# Patient Record
Sex: Female | Born: 1955 | Race: White | Hispanic: No | Marital: Married | Smoking: Never smoker
Health system: Northeastern US, Academic
[De-identification: ages and names within clinical notes are randomized; demographics above are authoritative.]

## PROBLEM LIST (undated history)

## (undated) ENCOUNTER — Encounter

---

## 2016-02-04 ENCOUNTER — Ambulatory Visit: Admitting: Hand Surgery

## 2016-03-31 ENCOUNTER — Ambulatory Visit: Admitting: Orthopaedic Surgery

## 2016-03-31 NOTE — Progress Notes (Signed)
.  Progress Notes  .  Patient: Kathy Morales, Kathy Morales  Provider: [name]    .  DOB: 07/06/1955 Age: 60 Y Sex: Female  .  PCP: Rella Larve   Date: 03/31/2016  .  --------------------------------------------------------------------------------  .  REASON FOR APPOINTMENT  .  1. L4-5 stenosis  .  2. Right lumbar radiculopathy  .  HISTORY OF PRESENT ILLNESS  .  GENERAL:   Kathy Morales is a pleasant 60 year old woman I am seeing today for  the first time. She was referred to me by Dr. Jonathon Bellows for right  lumbar radiculopathy. She has had right leg pain for many years  but it has recently flared up. She underwent a course of PT which  did not help. Dr. Jonathon Bellows did an epidural injection which gave her  excellent pain relief for about 24 hours before the pain  returned, which was attributed to the effect of the marcaine. She  has some mild pain in the left buttock, but most of the pain is  in the right buttock, posterior thigh and posterior calf. It  occasionally goes into the bottom of the right foot. It is worse  with any activity and by the end of the day she rates it a 10/10  in severity. When she is sitting, it is tolerable. She cannot  stand or walk for long periods of time and the leg pain prevents  her from gardening and cooking. She does home stretching which  does help a little. She denies any fever/chills, no changes in  bowel/bladder habits.  .  CURRENT MEDICATIONS  .  Taking Albuterol Sulfate 108 (90 Base) MCG/ACT Aerosol Powder  Breath Activated 2 puffs as needed Inhalation every 6 hrs  Taking Ativan 1 MG Tablet 1 tablet at bedtime as needed Orally  Once a day  Taking Calcium 1 tab Oral daily  Taking Fish Oil (omega-3 fatty acids)  Taking flovent 100MG  3 tabs twice daily  Taking HCTZ (hydrochlorothiazide) 50 mg twice daily  Taking Hydrocodone-Acetaminophen 5-325 MG Tablet 1-2 tablet as  needed Orally every 6 hrs  Taking Metformin 500 mg 1 tab Oral twice daily  Taking Pantoprazole Sodium 40 MG Tablet Delayed  Release 1 tablet  Orally twice daily  Taking Provigil 100 mg Tablet 3 tablet in the morning Orally Once  a day  Taking Simvastatin 80 MG Tablet 1 tablet in the evening Orally  Once a day  Taking Vitamin D once a day  Medication List reviewed and reconciled with the patient  .  PAST MEDICAL HISTORY  .  High cholesterol  Hypertention  NIDDM  GERD  Asthma  .  ALLERGIES  .  aspirin  NSAIDs  .  SURGICAL HISTORY  .  mastectomy for breast CA 2004  .  FAMILY HISTORY  .  Mother: alive, diagnosed with Diabetes, Heart Disease,  Hypertension  Father: alive, diagnosed with Diabetes, Hypertension, Heart  Disease  Non-Contributory  .  SOCIAL HISTORY  .  .  Tobaccohistory:Never smoked.  .  Work/Occupation: employed full-time, Engineer, site.  .  .  Alcohol Yes but rarely.  .  Lives at home independently, recently started using a cane to  ambulate.  [name]  REVIEW OF SYSTEMS  .  Per HPI.  [name]  VITAL SIGNS  .  Pain scale 8.  .  EXAMINATION  .  GENERAL: Lumbar spine MRI from an OSH in October  2017 was reviewed and uploaded into our system. I also reviewed  the radiologist's report. There are mild multilevel degenerative  changes seen throughout. Alignment is within normal limits. No  evidence of listhesis but there does appear to be a congenitally  narrow canal. At L4-5 there is a posterior disc bulge and  hypertrophic ligamentum flavum that results in severe central  stenosis and moderate foraminal stenosis. L5-S1 there is a small  posterior disc bulge that does not result in any canal narrowing.  No significant foraminal stenosis at L5-S1.  [name]  PHYSICAL EXAMINATION  .  Patient is well-nourished, well-appearing and in no distress.  Ambulates with an antalgic gait favoring the right leg and is  able to heel and toe walk with good strength and coordination.  Posture and alignment are within normal limits. 5/5 strength in  iliopsoas, quads, tibialis anterior, EHL and gastroc muscle  groups. Sensation is intact to light touch in the  L3-S1 nerve  distributions bilaterally but diminished in the right L5 and S1  distributions compared to the left. Negative straight leg raise  bilaterally. Diminished patellar and Achilles reflexes  bilaterally. Extremities are warm and well-perfused without any  peripheral edema.  .  ASSESSMENTS  .  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  Lumbar radiculopathy, right - M54.16  .  TREATMENT  .  Lumbar stenosis without neurogenic claudication  Notes: All findings were reviewed with Kathy Morales in the office  today. We reviewed her MRI results together. Her symptoms of  right sided lumbar radiculopathy correlate well with the stenosis  seen on MRI at L4-5. She has tried numerous conservative  treatments including medications, PT and an epidural injection  which actually aggravated her leg pain. She is interested in  definitive surgical intervention and I believe she is a good  candidate for L4-5 decompression and instrumented fusion due to  her congenital stenosis and preserved disc height at L4-5. I  believe that a decompression surgery alone would have a high  likelilhood of developing instability in the future because I  will have to resect a large portion of her facet joints to  achieve adequade decompression of the neural elements. I briefly  described the risks and benefits of operative intervention as  well as the expected recovery time and she wishes to proceed. We  will schedule surgery for sometime in January and I will see her  back in the office for a preop visit. All questions were answered  and she agrees with the plan.  [name]  PROCEDURES  .  Please cc: Dr. Nelly Rout fax: [phone].  [name]  FOLLOW UP  .  for preop visit  .  Electronically signed by [name] , MD on  03/31/2016 at 04:51 PM EDT  .  Document electronically signed by [name]    .

## 2016-03-31 NOTE — Progress Notes (Signed)
* * *        **Kathy Morales**    --- ---    60 Y old Female, DOB: Oct 17, 1955, External MRN: [id number]    Account Number: [id number]    [address] [place], [id number]    Home: [phone]    Insurance: HMO BLUE OUT [employer]    PCP: Rella Larve Referring: Jerilee Field    [facility]        * * *    03/31/2016  Progress Notes: [name], MD **CHN#:** [phone]    --- ---    ---        Reason for Appointment    ---      1\. L4-5 stenosis    ---    2\. Right lumbar radiculopathy    ---      History of Present Illness    ---     _GENERAL_ :    Kathy Morales is a pleasant 60 year old woman I am seeing today for the first time.  She was referred to me by Dr. Jonathon Bellows for right lumbar radiculopathy. She has  had right leg pain for many years but it has recently flared up. She underwent  a course of PT which did not help. Dr. Jonathon Bellows did an epidural injection which  gave her excellent pain relief for about 24 hours before the pain returned,  which was attributed to the effect of the marcaine. She has some mild pain in  the left buttock, but most of the pain is in the right buttock, posterior  thigh and posterior calf. It occasionally goes into the bottom of the right  foot. It is worse with any activity and by the end of the day she rates it a  10/10 in severity. When she is sitting, it is tolerable. She cannot stand or  walk for long periods of time and the leg pain prevents her from gardening and  cooking. She does home stretching which does help a little. She denies any  fever/chills, no changes in bowel/bladder habits.      Current Medications    ---    Taking     * Albuterol Sulfate 108 (90 Base) MCG/ACT Aerosol Powder Breath Activated 2 puffs as needed Inhalation every 6 hrs    ---    * Ativan 1 MG Tablet 1 tablet at bedtime as needed Orally Once a day    ---    * Calcium 1 tab Oral daily    ---    * Fish Oil (omega-3 fatty acids)     ---    * flovent 100MG  3 tabs twice daily    ---     * HCTZ (hydrochlorothiazide) 50 mg twice daily    ---    * Hydrocodone-Acetaminophen 5-325 MG Tablet 1-2 tablet as needed Orally every 6 hrs    ---    * Metformin 500 mg 1 tab Oral twice daily    ---    * Pantoprazole Sodium 40 MG Tablet Delayed Release 1 tablet Orally twice daily    ---    * Provigil 100 mg Tablet 3 tablet in the morning Orally Once a day    ---    * Simvastatin 80 MG Tablet 1 tablet in the evening Orally Once a day    ---    * Vitamin D once a day    ---    * Medication List reviewed and reconciled with the patient    ---  Past Medical History    ---       High cholesterol.        ---    Hypertention.        ---    NIDDM.        ---    GERD.        ---    Asthma.        ---      Surgical History    ---      mastectomy for breast CA 2004    ---      Family History    ---      Mother: alive, diagnosed with Diabetes, Heart Disease, Hypertension    ---    Father: alive, diagnosed with Diabetes, Hypertension, Heart Disease    ---    Non-Contributory    ---      Social History    ---    Tobacco  history: Never smoked.    Work/Occupation: employed full-time, Engineer, site. [name]    Alcohol  Yes but rarely.   Lives at home independently, recently started using  a cane to ambulate.    ---      Allergies    ---      aspirin    ---    NSAIDs    ---      Review of Systems    ---    Per HPI.      Vital Signs    ---    Pain scale 8.      Examination    ---     _GENERAL_ :    Lumbar spine MRI from an OSH in October 2017 was reviewed and uploaded into  our system. I also reviewed the radiologist's report. There are mild  multilevel degenerative changes seen throughout. Alignment is within normal  limits. 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 At L4-5 there is a posterior disc bulge and hypertrophic  ligamentum flavum that results in severe central stenosis and moderate  foraminal stenosis. L5-S1 there is a small posterior disc bulge that does not  result  in any canal narrowing. No significant foraminal stenosis at L5-S1.          Physical Examination    ---    Patient is well-nourished, well-appearing and in no distress. Ambulates with  an antalgic gait favoring the right leg and is able to heel and toe walk with  good strength and coordination. Posture and alignment are within normal  limits. 5/5 strength in iliopsoas, quads, tibialis anterior, EHL and gastroc  muscle groups. Sensation is intact to light touch in the L3-S1 nerve  distributions bilaterally but diminished in the right L5 and S1 distributions  compared to the left. Negative straight leg raise bilaterally. Diminished  patellar and Achilles reflexes bilaterally. Extremities are warm and well-  perfused without any peripheral edema.      Assessments    ---    1\. Lumbar stenosis without neurogenic claudication - M48.061 (Primary)    ---    2\. Lumbar radiculopathy, right - M54.16    ---      Treatment    ---       **1\. Lumbar stenosis without neurogenic claudication**    Notes: All findings were reviewed with Carolyn in the office today. We  reviewed her MRI results together. Her symptoms of right sided lumbar  radiculopathy correlate well with the stenosis  seen on MRI at L4-5. She has  tried numerous conservative treatments including medications, PT and an  epidural injection which actually aggravated her leg pain. She is interested  in definitive surgical intervention and I believe she is a good candidate for  L4-5 decompression and instrumented fusion due to her congenital stenosis and  preserved disc height at L4-5. I believe that a decompression surgery alone  would have a high likelilhood of developing instability in the future because  I will have to resect a large portion of her facet joints to achieve adequade  decompression of the neural elements. I briefly described the risks and  benefits of operative intervention as well as the expected recovery time and  she wishes to proceed. We will  schedule surgery for sometime in January and I  will see her back in the office for a preop visit. All questions were answered  and she agrees with the plan.    ---      Procedures    ---    Please cc: Dr. Nelly Rout fax: [phone].      Follow Up    ---    for preop visit    Electronically signed by [name] , MD on 03/31/2016 at 04:51 PM EDT    Sign off status: Completed        * * *        [facility]    [address]    [place], [place] [address]    Tel: [phone]    Fax: [phone]              * * *          Patient: OLISA, QUESNEL DOB: Dec 27, 1955 Progress Note: [name], MD  03/31/2016    ---    Note generated by eClinicalWorks EMR/PM Software ([email or web address])

## 2016-04-21 ENCOUNTER — Ambulatory Visit: Admitting: Orthopaedic Surgery

## 2016-04-21 ENCOUNTER — Ambulatory Visit: Admitting: Anesthesiology

## 2016-04-21 ENCOUNTER — Ambulatory Visit

## 2016-04-21 LAB — HX HEM-ROUTINE
HX HCT: 42.1 % (ref 32.0–45.0)
HX HGB: 13.6 g/dL (ref 11.0–15.0)
HX MCH: 29.5 pg (ref 26.0–34.0)
HX MCHC: 32.3 g/dL (ref 32.0–36.0)
HX MCV: 91.3 fL (ref 80.0–98.0)
HX MPV: 10.3 fL (ref 9.1–11.7)
HX NRBC #: 0 10*3/uL
HX NUCLEATED RBC: 0 %
HX PLT: 336 10*3/uL (ref 150–400)
HX RBC BLOOD COUNT: 4.61 M/uL (ref 3.70–5.00)
HX RDW: 12.6 % (ref 11.5–14.5)
HX WBC: 8.9 10*3/uL (ref 4.0–11.0)

## 2016-04-21 LAB — HX CHEM-METABOLIC: HX HEMOGLOBIN A1C: 6.2 % — ABNORMAL HIGH (ref 4.3–5.8)

## 2016-04-21 LAB — HX TRANSFUSION
HX ABO-RH INTERPRETATION (GEL): A POS
HX ANTIBODY SCREEN (GEL): NEGATIVE

## 2016-04-21 LAB — HX CHEM-PANELS
HX ANION GAP: 12 (ref 5–18)
HX BLOOD UREA NITROGEN: 14 mg/dL (ref 6–24)
HX CHLORIDE (CL): 98 meq/L (ref 98–110)
HX CO2: 30 meq/L (ref 20–30)
HX CREATININE (CR): 0.84 mg/dL (ref 0.57–1.30)
HX GFR, AFRICAN AMERICAN: 87 mL/min/{1.73_m2} — ABNORMAL LOW
HX GFR, NON-AFRICAN AMERICAN: 75 mL/min/{1.73_m2} — ABNORMAL LOW
HX GLUCOSE: 163 mg/dL — ABNORMAL HIGH (ref 70–139)
HX POTASSIUM (K): 3.6 meq/L (ref 3.6–5.1)
HX SODIUM (NA): 140 meq/L (ref 135–145)

## 2016-04-21 LAB — HX DIABETES
HX GLUCOSE: 163 mg/dL — ABNORMAL HIGH (ref 70–139)
HX HEMOGLOBIN A1C: 6.2 % — ABNORMAL HIGH (ref 4.3–5.8)

## 2016-04-21 NOTE — Progress Notes (Signed)
****    ---    **  Patient:** Kathy Morales, Kathy Morales     **Account Number:** [id number] **External MRN:** [id number]  **Provider:**  Appointment Resource     **DOB:** 09/06/1955 **Age:** 66 Y **Sex:** Female  **Date:** 04/21/2016     **Phone:** [phone]     **Address:** [address] [place], [id number]     **Pcp:** Clare Charon Silver        * * *         **Subjective:**        ---      **Chief Complaints:**    ------      1\. M54.16 DOS 05/06/16. 2. Please see Clinic Notes in [facility].[name]    ------     **Medical History:** High cholesterol, Hypertention, NIDDM, GERD, Asthma.        ------     **Allergies:** aspirin, NSAIDs.    ------        **Objective:**        ---         **Assessment:**        ---         **Plan:**        ---        ------    ---    ---          **Provider:** Appointment Resource    ---     **Patient:** Kathy Morales, Kathy Morales **DOB:** [birth year] **Date:** 04/21/2016    ---    Electronically signed by Bettey Mare on 04/21/2016 at 10:41 AM EST    Sign off status: Completed

## 2016-04-21 NOTE — Progress Notes (Signed)
* * *        **Kathy Morales**    --- ---    77 Y old Female, DOB: 1956/05/31, External MRN: [id number]    Account Number: [id number]    [address] [name], [place]    Home: [phone]    Insurance: HMO BLUE OUT [employer]    PCP: Rella Larve Referring: Rella Larve    [facility]        * * *    04/21/2016  Progress Notes: [name], MD **CHN#:** [phone]    --- ---    ---        Reason for Appointment    ---      1\. L4-5 stenosis    ---    2\. Right lumbar radiculopathy    ---      History of Present Illness    ---     _GENERAL_ Kathy Morales is here today for preop visit with me and with our anesthesiologists.  She is scheduled for L4-5 TLIF on 05/06/16. She reports no change in her  symptoms since I saw her last. She is still having right leg pain in an L5  distribution as well as bilateral buttock pain when she stands or walks for  more than a few minutes. It is improved with rest. She has tried multiple  modes of conservative therapy over the years, all without lasting relief  therefore she is seeking definitive surgical management. She has had a recent  URI and was on a steroid taper for her respiratory symptoms so her blood  sugard have been more difficult to control.      Current Medications    ---    Taking     * Albuterol Sulfate 108 (90 Base) MCG/ACT Aerosol Powder Breath Activated 2 puffs as needed Inhalation every 6 hrs    ---    * Ativan 1 MG Tablet 1 tablet at bedtime as needed Orally Once a day    ---    * Calcium 1 tab Oral daily    ---    * Fish Oil (omega-3 fatty acids)     ---    * flovent 100MG  3 tabs twice daily    ---    * HCTZ (hydrochlorothiazide) 50 mg twice daily    ---    * Hydrocodone-Acetaminophen 5-325 MG Tablet 1-2 tablet as needed Orally every 6 hrs    ---    * Metformin 500 mg 1 tab Oral twice daily    ---    * Pantoprazole Sodium 40 MG Tablet Delayed Release 1 tablet Orally twice daily    ---    * Provigil 100 mg Tablet 3 tablet in  the morning Orally Once a day    ---    * Simvastatin 80 MG Tablet 1 tablet in the evening Orally Once a day    ---    * Vitamin D once a day    ---    * Medication List reviewed and reconciled with the patient    ---      Past Medical History    ---       High cholesterol.        ---    Hypertention.        ---    NIDDM.        ---    GERD.        ---    Asthma.        ---  Surgical History    ---      mastectomy for breast CA 2004    ---      Family History    ---      Mother: alive, diagnosed with Diabetes, Hypertension, Heart Disease    ---    Father: alive, diagnosed with Diabetes, Hypertension, Heart Disease    ---    Non-Contributory    ---      Social History    ---    Tobacco  history: Never smoked.    Work/Occupation: employed full-time, Engineer, site. [name]    Alcohol  Yes but rarely.   Lives at home independently, recently started using  a cane to ambulate.    ---      Allergies    ---      aspirin    ---    NSAIDs    ---      Review of Systems    ---    Per HPI.      Vital Signs    ---    Pain scale 8.      Examination    ---     _GENERAL_ :    Lumbar spine MRI from an OSH in October 2017 was reviewed again. I also  reviewed the radiologist's report. There are mild multilevel degenerative  changes seen throughout. Alignment is within normal limits. No evidence of  listhesis but there does appear to be a congenitally narrow canal. At L4-5  there is a posterior disc bulge and hypertrophic ligamentum flavum that  results in severe central stenosis and moderate bilateral foraminal stenosis.  L5-S1 there is a small posterior disc bulge that does not result in any canal  narrowing. No significant foraminal stenosis at L5-S1.    Upright lumbar spine radiographs were reviewed. There is maintenance of lumbar  lordosis, no listhesis. degenerative disc disease at L5-S1 with loss of  height. Disc height at other levels is preserved. No evidence of dynamic  instability.          Physical Examination     ---    Patient is well-nourished, well-appearing and in no distress. Ambulates with  an antalgic gait favoring the right leg and is able to heel and toe walk with  good strength and coordination. Posture and alignment are within normal  limits. 5/5 strength in iliopsoas, quads, tibialis anterior, EHL and gastroc  muscle groups. Sensation is intact to light touch in the L3-S1 nerve  distributions bilaterally but diminished in the right L5 and S1 distributions  compared to the left. Negative straight leg raise bilaterally. Diminished  patellar and Achilles reflexes bilaterally. Extremities are warm and well-  perfused without any peripheral edema.      Assessments    ---    1\. Lumbar radiculopathy, right - M54.16    ---    2\. 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 Others**    Notes: All findings were reviewed with Mackenzey in the office today. Her  symptoms of right sided lumbar radiculopathy correlate well with the stenosis  seen on MRI at L4-5. I believe she does have an element of neurogenic  claudication with some bilateral buttock pain as well. She has tried numerous  conservative treatments including medications, PT and an epidural injection  all without lasting symptom relief. I believe she  is a good candidate for L4-5  decompression and instrumented fusion due to her congenital stenosis and  preserved disc height at L4-5. I believe that a decompression surgery alone  would have a high likelilhood of developing instability in the future because  I will have to resect a large portion of her facet joints to achieve adequade  decompression of the neural elements. The procedure, risks and benefits were  discussed with the patient in detail. Risks include, but are not limited to,  bleeding, infection, nerve injury, paralysis, permanent numbness or weakness,  persistent back/leg pain, CSF leak/dural tear, hardware failure or  malpositioning,  nonunion, need for further surgery, and medical/anesthesia  complications. All of these risks were discussed in layman's terms. She has a  good understanding and reasonable expectations with regard to expected  surgical outcomes, goals of surgery, and recovery time. All questions were  answered. Informed consent was signed in the office today. Of note, she is  going to try and recover in [place] after her two week postop visit due to some  social reasons. Her husband will be traveling with her and helping to take  care of her. I will call her with the results of her hemoglobin A1c if it is  over 8.0 and we will unfortunately have to postpone her surgery if that is the  case. She understands and agrees with the plan.    ---      Procedures    ---    Please cc: Dr. Nelly Rout fax: [phone].      Follow Up    ---    for preop visit    Electronically signed by [name] , MD on 04/21/2016 at 02:40 PM EST    Sign off status: Completed        * * *        [facility]    [address]    [place], [place] [address]    Tel: [phone]    Fax: [phone]              * * *          Patient: Kathy Morales, Kathy Morales DOB: 1955/09/09 Progress Note: [name], MD  04/21/2016    ---    Note generated by eClinicalWorks EMR/PM Software ([email or web address])

## 2016-04-21 NOTE — Progress Notes (Signed)
.  Progress Notes  .  Patient: Kathy Morales, Kathy Morales  Provider: [name]    .  DOB: 09/26/1955 Age: 60 Y Sex: Female  .  PCP: Rella Larve   Date: 04/21/2016  .  --------------------------------------------------------------------------------  .  REASON FOR APPOINTMENT  .  1. L4-5 stenosis  .  2. Right lumbar radiculopathy  .  HISTORY OF PRESENT ILLNESS  .  GENERAL:   Aerilyn is here today for preop visit with me and with our  anesthesiologists. She is scheduled for L4-5 TLIF on 05/06/16. She  reports no change in her symptoms since I saw her last. She is  still having right leg pain in an L5 distribution as well as  bilateral buttock pain when she stands or walks for more than a  few minutes. It is improved with rest. She has tried multiple  modes of conservative therapy over the years, all without lasting  relief therefore she is seeking definitive surgical management.  She has had a recent URI and was on a steroid taper for her  respiratory symptoms so her blood sugard have been more difficult  to control.  .  CURRENT MEDICATIONS  .  Taking Albuterol Sulfate 108 (90 Base) MCG/ACT Aerosol Powder  Breath Activated 2 puffs as needed Inhalation every 6 hrs  Taking Ativan 1 MG Tablet 1 tablet at bedtime as needed Orally  Once a day  Taking Calcium 1 tab Oral daily  Taking Fish Oil (omega-3 fatty acids)  Taking flovent 100MG  3 tabs twice daily  Taking HCTZ (hydrochlorothiazide) 50 mg twice daily  Taking Hydrocodone-Acetaminophen 5-325 MG Tablet 1-2 tablet as  needed Orally every 6 hrs  Taking Metformin 500 mg 1 tab Oral twice daily  Taking Pantoprazole Sodium 40 MG Tablet Delayed Release 1 tablet  Orally twice daily  Taking Provigil 100 mg Tablet 3 tablet in the morning Orally Once  a day  Taking Simvastatin 80 MG Tablet 1 tablet in the evening Orally  Once a day  Taking Vitamin D once a day  Medication List reviewed and reconciled with the patient  .  PAST MEDICAL HISTORY  .  High  cholesterol  Hypertention  NIDDM  GERD  Asthma  .  ALLERGIES  .  aspirin  NSAIDs  .  SURGICAL HISTORY  .  mastectomy for breast CA 2004  .  FAMILY HISTORY  .  Mother: alive, diagnosed with Diabetes, Hypertension, Heart  Disease  Father: alive, diagnosed with Diabetes, Hypertension, Heart  Disease  Non-Contributory  .  SOCIAL HISTORY  .  .  Tobaccohistory:Never smoked.  .  Work/Occupation: employed full-time, Engineer, site.  .  .  Alcohol Yes but rarely.  .  Lives at home independently, recently started using a cane to  ambulate.  [name]  REVIEW OF SYSTEMS  .  Per HPI.  [name]  VITAL SIGNS  .  Pain scale 8.  .  EXAMINATION  .  GENERAL: Lumbar spine MRI from an OSH in October  2017 was reviewed again. I also reviewed the radiologist's  report. There are mild multilevel degenerative changes seen  throughout. Alignment is within normal limits. No evidence of  listhesis but there does appear to be a congenitally narrow  canal. At L4-5 there is a posterior disc bulge and hypertrophic  ligamentum flavum that results in severe central stenosis and  moderate bilateral foraminal stenosis. L5-S1 there is a small  posterior disc bulge that does not result in any canal narrowing.  No significant foraminal stenosis at L5-S1.Upright lumbar spine  radiographs were reviewed. There is maintenance of lumbar  lordosis, no listhesis. degenerative disc disease at L5-S1 with  loss of height. Disc height at other levels is preserved. No  evidence of dynamic instability.  .  PHYSICAL EXAMINATION  .  Patient is well-nourished, well-appearing and in no distress.  Ambulates with an antalgic gait favoring the right leg and is  able to heel and toe walk with good strength and coordination.  Posture and alignment are within normal limits. 5/5 strength in  iliopsoas, quads, tibialis anterior, EHL and gastroc muscle  groups. Sensation is intact to light touch in the L3-S1 nerve  distributions bilaterally but diminished in the right L5 and  S1  distributions compared to the left. Negative straight leg raise  bilaterally. Diminished patellar and Achilles reflexes  bilaterally. Extremities are warm and well-perfused without any  peripheral edema.  .  ASSESSMENTS  .  Lumbar radiculopathy, right - M54.16  .  Spinal stenosis of lumbar region with neurogenic claudication -  M48.062  .  TREATMENT  .  Others  Notes: All findings were reviewed with Courtnie in the office  today. Her symptoms of right sided lumbar radiculopathy correlate  well with the stenosis seen on MRI at L4-5. I believe she does  have an element of neurogenic claudication with some bilateral  buttock pain as well. She has tried numerous conservative  treatments including medications, PT and an epidural injection  all without lasting symptom relief. I believe she is a good  candidate for L4-5 decompression and instrumented fusion due to  her congenital stenosis and preserved disc height at L4-5. I  believe that a decompression surgery alone would have a high  likelilhood of developing instability in the future because I  will have to resect a large portion of her facet joints to  achieve adequade decompression of the neural elements. The  procedure, risks and benefits were discussed with the patient in  detail. Risks include, but are not limited to, bleeding,  infection, nerve injury, paralysis, permanent numbness or  weakness, persistent back/leg pain, CSF leak/dural tear, hardware  failure or malpositioning, nonunion, need for further surgery,  and medical/anesthesia complications. All of these risks were  discussed in layman's terms. She has a good understanding and  reasonable expectations with regard to expected surgical  outcomes, goals of surgery, and recovery time. All questions were  answered. Informed consent was signed in the office today. Of  note, she is going to try and recover in [place] after her two  week postop visit due to some social reasons. Her husband will be  traveling  with her and helping to take care of her. I will call  her with the results of her hemoglobin A1c if it is over 8.0 and  we will unfortunately have to postpone her surgery if that is the  case. She understands and agrees with the plan.  [name]  PROCEDURES  .  Please cc: Dr. Nelly Rout fax: [phone].  [name]  FOLLOW UP  .  for preop visit  .  Electronically signed by [name] , MD on  04/21/2016 at 02:40 PM EST  .  Document electronically signed by [name]    .

## 2016-05-06 ENCOUNTER — Inpatient Hospital Stay: Admission: RE | Admit: 2016-05-06 | Discharge: 2016-05-09 | Disposition: A | Discharge status: Home Health | Payer: HMO

## 2016-05-06 ENCOUNTER — Inpatient Hospital Stay
Admit: 2016-05-06 | Disposition: A | Discharge status: Home Health | Source: Ambulatory Visit | Attending: Orthopaedic Surgery | Admitting: Orthopaedic Surgery

## 2016-05-06 LAB — HX POINT OF CARE
HX GLUCOSE-POCT: 116 mg/dL (ref 70–139)
HX GLUCOSE-POCT: 138 mg/dL (ref 70–139)
HX GLUCOSE-POCT: 126 mg/dL (ref 70–139)

## 2016-05-06 NOTE — Op Note (Signed)
Patient    Kathy Morales, Kathy Morales            Med Rec #:  [id number]  Name:  Operation  05/06/2016                Pt.  Dt:                                  Location:  .  [name]                               OPERATIVE REPORT  .  [name]  PREOPERATIVE DIAGNOSES:  1.  Right L5 radiculopathy.  2.  L4-L5 stenosis.  [name]  POSTOPERATIVE DIAGNOSES:  1.  Right L5 radiculopathy.  2.  L4-L5 stenosis.  [name]  PROCEDURE PERFORMED:  1.  L4-L5 laminectomy with a right-sided facetectomy for decompression for  central and lateral recess decompression of the L5 nerve root.  2.  Posterior L4-L5 fusion with cortical screw instrumentation.  3.  Insertion of interbody device, L4-L5.  4.  Use of local autologous and a Formagraft bone graft extender.  5. Repair of incidental durotomy x2.  .  SURGEON:  [name], M.D.  .  ASSISTANT:  Ellie Lunch, PA-C.  [name]  ANESTHESIA:  General endotracheal.  .  ESTIMATED BLOOD LOSS:  100 mL.  .  IV FLUIDS:  2 liters of crystalloid.  .  IMPLANTS USED:  NuVasive MAS-PLIF cortical screw system was used.  All 4  screws measured 5.5x35 mm.  Interbody device was a 11 mm cage in height,  25mm in length with 4 degrees of lordosis.  Two 40 mm prebent lordotic  rods, 4 modular heads and 4 set screws were also used.  [name]  FINDINGS:  Severe central and lateral recess stenosis at L4-L5 with a very  thin dura and thickened ligamentum flavum with dural adhesions.  .  COMPLICATIONS:  None.  .  CONDITION:  Extubated and stable to the recovery room.  .  INDICATIONS FOR PROCEDURE:  Kathy Morales is a 60 year old woman with a history  of right-sided leg pain for many years.  She has undergone a course of  physical therapy and tried multiple epidural injections, which only gave  her relief for about 24 hours.  Her MRI shows severe central and lateral  recess stenosis at L4-L5.  I felt that her symptoms correlated well with  the MRI and she was interested in definitive operative intervention.  I  recommended L4-L5 decompression and given the amount of  stenosis and a  congenitally narrow canal, I did also recommend a fusion procedure with  interbody device and cortical trajectory screws.  I explained that the  risks of surgery include but are not limited to bleeding, infection,  persistent leg pain and/or back pain, spinal fluid leak/dural tear, bowel  or bladder dysfunction, hardware malposition or failure, nonunion and  persistent numbness, tingling or weakness in her legs in addition to  medical/anesthesia related complications.  There is also risk of adjacent  segment degeneration given her relatively young age.  Once all of her  questions were answered, Kathy Morales elected to proceed with operative  intervention.  Informed consent was obtained.  .  DESCRIPTION OF PROCEDURE:  The patient was seen and identified in the  preoperative holding area.  The lumbar region was marked.  She was brought  back to the  operating room and placed under general endotracheal anesthesia  without complication.  A Foley catheter was placed. She was then gently  transferred into the prone position on the Kathy Morales spine table.  All bony  prominences were padded.   She received Ancef for antibiotic prophylaxis  per protocol.  SCDs were on throughout the case for DVT prophylaxis.  The  lumbar region was prepped and draped in the usual sterile fashion.  Formal  timeout was done prior to the start of the procedure.  A sterile spinal  needle was placed in the lower lumbar region and a lateral fluoroscopic  image was obtained to plan the level of my incision.  Skin was sharply  incised in the midline centered over the L4-5 disc space and subcutaneous  tissues were dissected down using Bovie to maintain hemostasis.  I  identified the lumbar fascia and cleared it to either side with a Cobb.  Lumbar fascia was then incised inline with the skin incision using a Bovie.  The paraspinal muscles were reflected off of the L4 and L5 lamina  bilaterally.  I placed a Woodson at the level of the L4 pars  and obtained a  lateral fluoroscopic image to confirm the appropriate operative level.  I  switched the patient's right-sided and completed exposure and then placed a  Woodson at the level of the L5 pars and again confirmed the appropriate  operative level on lateral fluoroscopic image.  Care was taken to maintain  the integrity of the facet joint capsules at L3-L4.  A deep self-retaining  retractor was placed over the L4-L5 facet joints.  I identified the lateral  border of the L4 and L5 pars in order to use the anatomic landmarks to plan  the starting point of my cortical trajectory screws.  Once I had adequate  exposure, I obtained a perfect AP x-ray of the L4 vertebral body.  I was  able to see the medial border of the pedicles.  I used a high-speed bur to  make the appropriate starting point for my cortical screws at the 5 and 7  o'clock position on the left and right pedicles respectively.  The process  was then repeated for the starting point of the L5 cortical trajectory  screws.  I then brought the C-arm in for a lateral x-ray.  I obtained  lateral x-ray of L4 and L5.  Under lateral fluoroscopic guidance, the  high-speed bur followed by a hand drill the cortical trajectory screw pass  were prepared.  After each step, the trajectory was checked with the  ball-tip probe to ensure that there is a good bony endpoint and no medial  or lateral breaches.  The holes were then tapped with a 5.58mm tap.  I then  placed two 5.5x35 mm cortical screws at L4.  Both screws had excellent  purchase.  The process was repeated at L5 level.  Again, I confirmed that  there were no medial or lateral breaches and that there was good bony  endpoint using the ball-tip probe.  The L5 screws also had excellent  purchase.  I then turned my attention to the decompression portion of the  procedure.  The L4 spinous process and the cranial aspect of the L5 spinous  processes were removed with a rongeur.  Bone was saved for local  bone  graft.  The lamina of L4 and proximal aspect of L5 were thinned down using  a high-speed bur.  I carried out a central  laminectomy of the remaining  lamina staying above the ligamentum flavum.  Ligamentum was noted to be  very hypertrophic.  There were also many adhesions between the ligamentum  and the dura.  Her dura was noted to be extremely thin in some areas due to  the prolonged stenosis.  I carefully tried to develop a plane between the  ligamentum and the dura.  I removed the ligamentum flavum using a 3 and 4  mm Kerrison.  At the proximal aspect of the laminectomy, there was an  adhesion between the ligamentum flavum and the dura and I did encounter a  dural tear with an active spinal fluid leak.  This was covered with a patty  while I completed the laminectomy to expose the tear more completely.  Once  I could visualize where the spinal fluid leak was coming from, I performed  a dural repair using 6-0 Gore-Tex suture in a figure of 8 fashion.  There  was no active CSF leakage after that.  I then proceeded with the  right-sided lateral recess decompression.  I used a high-speed bur to  remove the majority of the L4-L5 facet joint.  The lateral recess,  especially on the right was noted to be extremely stenotic with a lot of  compression on the traversing L5 nerve root.  Again, there were a lot of  adhesions between the ligamentum flavum and the dura.  I carefully  developed a plane between the ligamentum and the dura using a Consulting civil engineer.  I was then able to identify the traversing L5 nerve root.  As I  continued my laminectomy in a caudal direction and removed the superior  aspect of the L5 lamina, I again encountered an area where the dura was  extremely thin and resulted another spinal fluid leak as the dural was  being manipulated during the decompression.  This was covered and protected  with a patty and I proceeded with the interbody portion of the procedure.  I mobilized the traversing L5  nerve root and thecal sac from the L4-L5 disc  on the right.  There were a lot of epidural veins that were actively  bleeding and these were controlled with bipolar electrocautery, Surgicel  and thrombin-soaked patties.  Once I had controlled the epidural bleeding  and identified the annulus of the L4-L5 disk, this was incised with a #15  blade.  The traversing L5 nerve root and thecal sac were protected with a  nerve root retractor during this portion of the procedure.  I introduced  the opening 7 mm shaver and checked the depth using a lateral fluoroscopic  image.  I then proceeded in 1 mm increments up to an 11 mm shaver.  This  had good bony endplate contact.  I confirmed this was of good height for  the implant using a lateral fluoroscopic image.  Curved curettes were used  to complete the disc space prep and remove endplate cartilage.  The disc  space was then packed with a mixture of Formagraft and local autograft  using a funnel.  The 11 mm in height with 4 degrees of lordosis titanium  interbody spacer was filled with the same mixture of bone graft.  I then  inserted the spacer into the disc space under lateral fluoroscopic  guidance.  I confirmed appropriate depth and then turned the implant and  rotated the implant 90 degrees.  The inserter was removed and the wound was  copiously irrigated.  I  confirmed that there was no further compression on  the traversing L5 nerve root.  The left lateral recess was not noted to be  as stenotic as the right side.  I palpated the medial aspect of the L4 and  L5 pedicles bilaterally using a Woodson elevator to confirm that there were  no medial breaches with the cortical screws.  The wound was again copiously  irrigated.  I then turned my attention to repairing the second dural tear.  Patties were removed and I had adequate exposure of spinal fluid leak.  Again, a 6-0 Gore-Tex suture was used to perform a simple dural repair in a  figure of 8 fashion.  I had  anesthesiologist perform Valsalva maneuver in  the patient up to 40 mmHg and hold it for 15 seconds.  There was no further  active CSF leakage.  The wound was again copiously irrigated.  The  left-sided facet joint was decorticated and the remaining bone graft  mixture was placed over the left-sided facet joint.  Modular heads were  placed on the cortical screws and the screws were advanced into their final  position.  Again, all screws had excellent bony purchase. Two prebent 40 mm  rods were placed in the screw heads and setscrews were placed and final  tightened.  I again checked the medial aspect of all of the L4 and L5  pedicles bilaterally and again confirmed that there were no breaches of the  cortical screws.  I confirmed that the right L5 nerve root was adequately  decompressed.  DuraSeal was then placed over the laminectomy defect.  FloSeal was placed in the gutters.  The deep self-retaining retractor was  removed and I inspected the wound for any active bleeding, which there was  none.  I obtained final lateral and AP x-rays and confirmed that all  implants were in good position.  Wound was then closed.  The lumbar fascia  was closed with running and interrupted #1 Vicryl suture.  Subcutaneous  tissue was closed with interrupted 2-0 Vicryl and skin was closed with a  running 3-0 nylon suture.  Sterile dressings were placed and the patient  was gently transferred to the Morales bed in the supine position.  The  Foley catheter was removed.  She was extubated without complications.  All  counts were correct x2 at the completion of the procedure.  There were no  complications during the case.  As the attending surgeon of record, I  attest I was present for the entire operation.  Ellie Lunch, PA-C was also  present as there was no qualified resident assistant available.  .  .  .  Electronically Signed  [name], MD 05/10/2016 07:58 A  .  .  .  .  Dictated by: [name], MD  .  D:     05/06/2016  T:    05/06/2016 03:19 P  Dictation ID:  [id number]/Doc#  [id number]  .  cc:  .  [name]      Document is preliminary until electronically or manually signed by                             attending physician.

## 2016-05-07 LAB — HX POINT OF CARE
HX GLUCOSE-POCT: 136 mg/dL (ref 70–139)
HX GLUCOSE-POCT: 147 mg/dL — ABNORMAL HIGH (ref 70–139)
HX GLUCOSE-POCT: 154 mg/dL — ABNORMAL HIGH (ref 70–139)

## 2016-05-08 LAB — HX POINT OF CARE
HX GLUCOSE-POCT: 121 mg/dL (ref 70–139)
HX GLUCOSE-POCT: 143 mg/dL — ABNORMAL HIGH (ref 70–139)
HX GLUCOSE-POCT: 148 mg/dL — ABNORMAL HIGH (ref 70–139)

## 2016-05-09 LAB — HX POINT OF CARE: HX GLUCOSE-POCT: 101 mg/dL (ref 70–139)

## 2016-05-10 ENCOUNTER — Ambulatory Visit: Admitting: Orthopaedic Surgery

## 2016-05-10 MED ORDER — Bactrim DS 800-160 mg 3 d: 6 | Freq: Two times a day (BID) | 0 refills | 0 days | Status: AC

## 2016-05-10 NOTE — Progress Notes (Signed)
* * *        **  [name]**    --- ---    2 Y old Female, DOB: 08-Jan-1956    [address], [place], [place] [address]    Home: [phone]    Provider: [name]        * * *    Telephone Encounter    ---    Answered by   Ellie Lunch  Date: 05/10/2016         Time: 08:12 AM    Caller   brett healy    --- ---            Reason   post op            Action Taken   S/P L4-L5 TLIF 05/06/16 complicated by dural tears. Patient was  discharged on Sunday, 05/09/16. Patient doing well. Lots of back pain.  Controlled with 2 mg Dilaudid, tizanidine and tylenol and gabapentin. Does not  need any refills at this time. Dressing is C/D/I. Advised to keep covered with  shower, can change dressing tomorrow. No headaches. Has not had a bowel  movement yet. Added MiraLax to senna yesterday. Advised milk of mag if needed.  Encouraged ice and ambulation. Reminded of postop appointment with time and  date. HEALY,BRETT , PA 05/10/2016 8:14:54 AM >                * * *                ---          * * *          Patient: CINDA, HARA DOB: Dec 31, 1955 Provider: [name]  05/10/2016    ---    Note generated by eClinicalWorks EMR/PM Software ([email or web address])

## 2016-05-10 NOTE — Progress Notes (Signed)
* * *        **  Kathy Morales**    --- ---    30 Y old Female, DOB: 12/27/1955    [address] [place], [place] [address]    Home: [phone]    Provider: [name]        * * *    Telephone Encounter    ---    Answered by   Larinda Buttery  Date: 05/10/2016         Time: 03:25 PM    Caller   [facility]    --- ---            Reason   Medication Questions            Message                      Wadley, Nurse from [facility], called to say they are admitting Kathy Morales for OT and PT. Wadley would like a call back regarding Avenell's medications. She wants clarification as to how many times Dayanne should be taking each of her medications every day as well as how many pills she should be taking.      Questions regarding: Tizanidine, Gabapentin and Dilaudid      CB: [phone]                Action Taken   [name],[name] 05/10/2016 3:28:06 PM > Spoke with the [facility].  Explained a good pain medication for her. HEALY,BRETT , PA 05/10/2016 3:44:30  PM >                * * *                ---          * * *          Patient: Kathy Morales, Kathy Morales DOB: 23-Feb-1956 Provider: [name]  05/10/2016    ---    Note generated by eClinicalWorks EMR/PM Software ([email or web address])

## 2016-05-10 NOTE — Progress Notes (Signed)
* * *        **  [name]**    --- ---    89 Y old Female, DOB: 01-02-1956    [address], [place], [place] [address]    Home: [phone]    Provider: [name]        * * *    Telephone Encounter    ---    Answered by   Ellie Lunch  Date: 05/10/2016         Time: 01:28 PM    Caller   patient    --- ---            Reason   UTI            Action Taken   patient has increase urinary frequency and burning with  urination x 3 days. Worse today. Called PCP, hasnt heard back. Has been  treated in the past for UTI with bactrim. No fevers or flank pain. No  hematuria. Encouraged being tested at her PCP, but she states she is in a lot  of pain and PCP is far. Prescribed bactrim 800-160 mg BID x3 days. HEALY,BRETT  , PA 05/10/2016 1:33:55 PM >            Refills  Start Bactrim DS 800-160 mg 3 d Tabs, DS 800-160 mg, orally, 6, one  tab, twice a day, 3 days, Refills=0    --- ---          * * *                ---          * * *          Patient: LILLIANE, SPOSITO DOB: 09-22-1955 Provider: [name]  05/10/2016    ---    Note generated by eClinicalWorks EMR/PM Software ([email or web address])

## 2016-05-11 ENCOUNTER — Ambulatory Visit: Admitting: Orthopaedic Surgery

## 2016-05-11 NOTE — Progress Notes (Signed)
* * *        **  Kathy Morales**    --- ---    20 Y old Female, DOB: Feb 28, 1956    [address], [place], [place] [address]    Home: [phone]    Provider: [name]        * * *    Telephone Encounter    ---    Answered by   Larinda Buttery  Date: 05/11/2016         Time: 01:02 PM    Caller   Self    --- ---            Reason   PT            Message                      [facility]-No services set up with PT for P/O care yet.                Action Taken   [name],[name] 05/11/2016 1:03:04 PM > Patient verifying that  she will have a therapist. I called Director of [facility] and he explained that they  would be calling patient either today or tomorrow. Called patient and let her  know.                * * *                ---          * * *          Patient: Kathy Morales, Kathy Morales DOB: 06-16-1955 Provider: [name]  05/11/2016    ---    Note generated by eClinicalWorks EMR/PM Software ([email or web address])

## 2016-05-17 ENCOUNTER — Ambulatory Visit: Admitting: Orthopaedic Surgery

## 2016-05-17 NOTE — Progress Notes (Signed)
* * *        **  Kathy Morales**    --- ---    65 Y old Female, DOB: [birth year]    [address], [place], [place] [address]    Home: [phone]    Provider: [name]        * * *    Telephone Encounter    ---    Answered by   Larinda Buttery  Date: 05/17/2016         Time: 01:23 PM    Caller   [facility]    --- ---            Reason   Call Back            Message                      Pam from [facility] is looking for Dr. Charlton Amor or your approval of Olyvia's PT plan. The CB of the nurse: [phone]                Action Taken   [name],[name] 05/17/2016 1:24:22 PM > PT plan, 3-4x per week,  patient planning on going a trip on saturday. HEALY,BRETT , PA 05/17/2016  2:03:44 PM >                * * *                ---          * * *          Patient: Kathy Morales, Kathy Morales DOB: 02/06/1956 Provider: [name]  05/17/2016    ---    Note generated by eClinicalWorks EMR/PM Software ([email or web address])

## 2016-05-21 ENCOUNTER — Ambulatory Visit: Admit: 2016-05-21 | Payer: HMO

## 2016-05-21 ENCOUNTER — Ambulatory Visit: Admitting: Orthopaedic Surgery

## 2016-05-21 NOTE — Progress Notes (Signed)
.  Progress Notes  .  Patient: Kathy Morales, Kathy Morales  Provider: [name]    .  DOB: [birth year] Age: 60 Y Sex: Female  .  PCP: Rella Larve   Date: 05/21/2016  .  --------------------------------------------------------------------------------  .  REASON FOR APPOINTMENT  .  1. s/p L4-5 TLIF on 05/06/16  .  HISTORY OF PRESENT ILLNESS  .  GENERAL:   Melenie is here today for her first postop follow up. Her leg  pain is completely resolved and her UTI symptoms have resolved  after a course of antibiotics. She has back pain and stiffness,  worse with sitting and better with repositioning, and she is  taking Dilaudid which helps the pain. She has stopped taking  Zanaflex since it was not helping and was giving her some GERD.  She is planning on going to [place] this week for Christmas.  Appetite, bowel and bladder function are at baseline. No  fever/chills or wound drainage.  .  CURRENT MEDICATIONS  .  Taking Albuterol Sulfate 108 (90 Base) MCG/ACT Aerosol Powder  Breath Activated 2 puffs as needed Inhalation every 6 hrs  Taking Ativan 1 MG Tablet 1 tablet at bedtime as needed Orally  Once a day  Taking Calcium 1 tab Oral daily  Taking Dilaudid  Taking Fish Oil (omega-3 fatty acids)  Taking flovent 100MG  3 tabs twice daily  Taking HCTZ (hydrochlorothiazide) 50 mg twice daily  Taking Hydrocodone-Acetaminophen 5-325 MG Tablet 1-2 tablet as  needed Orally every 6 hrs  Taking Metformin 500 mg 1 tab Oral twice daily  Taking Pantoprazole Sodium 40 MG Tablet Delayed Release 1 tablet  Orally twice daily  Taking Provigil 100 mg Tablet 3 tablet in the morning Orally Once  a day  Taking Simvastatin 80 MG Tablet 1 tablet in the evening Orally  Once a day  Taking Vitamin D once a day  Medication List reviewed and reconciled with the patient  .  PAST MEDICAL HISTORY  .  High cholesterol  Hypertention  NIDDM  GERD  Asthma  .  ALLERGIES  .  aspirin  NSAIDs  .  SURGICAL HISTORY  .  mastectomy for breast CA 2004  .  FAMILY  HISTORY  .  Mother: alive, diagnosed with Diabetes, Hypertension, Heart  Disease  Father: alive, diagnosed with Diabetes, Hypertension, Heart  Disease  Non-Contributory  .  SOCIAL HISTORY  .  .  Tobaccohistory:Never smoked.  .  Work/Occupation: employed full-time, Engineer, site.  .  .  Alcohol Yes but rarely.  .  Lives at home independently, recently started using a cane to  ambulate.  [name]  REVIEW OF SYSTEMS  .  Per HPI, otherwise negative.  [name]  VITAL SIGNS  .  Pain scale 4.  .  EXAMINATION  .  GENERAL: Upright lumbar spine x-rays were reviewed.  Lumbar lordosis is preserved. All hardware including interbody  device is in good position without evidence of failure.  [name]  PHYSICAL EXAMINATION  .  Patient is well-nourished, well-appearing and in no distress.  Ambulates with use of a cane in the office today. Midline lumbar  insision is clean dry and intact with nylon sutures. No  surrounding erythema or drainage. 5/5 strength in iliopsoas,  quads, tibialis anterior, EHL and gastroc muscle groups.  Sensation is intact to light touch in the L3-S1 nerve  distributions bilaterally. Extremities are warm and well-perfused  without any peripheral edema.  .  ASSESSMENTS  .  Lumbar radiculopathy, right - M54.16 (  Primary)  .  Spinal stenosis of lumbar region with neurogenic claudication -  M48.062  .  S/P lumbar fusion - Z98.1  .  TREATMENT  .  Lumbar radiculopathy, right  Notes: All findings were reviewed with Talma in the office  today. She is very happy with the early results of her surgery as  her leg pain is completely resolved. We reassured her that back  pain is to be expected at this point after surgery and she does  think it is getting better each day. We provided her with a  refill of Dilaudid (#90) for her trip to [place] this week. I  emphasized that she needs to get up and walk on the plane  frequently to prevent blood clots and to help with her back pain.  She can also use ice packs on the plane. I reminded  her of her  postop lifting and twisting restrictions. I will see her back in  the office in 4 weeks for routine follow up with lumbar x-rays.  All questions were answered and she agrees with the plan.  .  FOLLOW UP  .  4 Weeks with x-rays  .  Electronically signed by [name] , MD on  05/26/2016 at 09:30 AM EST  .  Document electronically signed by [name]    .

## 2016-05-21 NOTE — Progress Notes (Signed)
* * *        **Kathy Morales**    --- ---    36 Y old Female, DOB: 1956-05-15, External MRN: [id number]    Account Number: [id number]    [address] [name], [id number]    Home: [phone]    Insurance: H[employer] BLUE OUT [employer]    PCP: Rella Larve Referring: Rella Larve    [facility]        * * *    05/21/2016  Progress Notes: [name], MD **CHN#:** [id number]    --- ---    ---        Reason for Appointment    ---      1\. s/p L4-5 TLIF on 05/06/16    ---      History of Present Illness    ---     _GENERAL_ Kathy Morales is here today for her first postop follow up. Her leg pain is  completely resolved and her UTI symptoms have resolved after a course of  antibiotics. She has back pain and stiffness, worse with sitting and better  with repositioning, and she is taking Dilaudid which helps the pain. She has  stopped taking Zanaflex since it was not helping and was giving her some GERD.  She is planning on going to [place] this week for Christmas. Appetite, bowel  and bladder function are at baseline. No fever/chills or wound drainage.      Current Medications    ---    Taking     * Albuterol Sulfate 108 (90 Base) MCG/ACT Aerosol Powder Breath Activated 2 puffs as needed Inhalation every 6 hrs    ---    * Ativan 1 MG Tablet 1 tablet at bedtime as needed Orally Once a day    ---    * Calcium 1 tab Oral daily    ---    * Dilaudid     ---    * Fish Oil (omega-3 fatty acids)     ---    * flovent 100MG  3 tabs twice daily    ---    * HCTZ (hydrochlorothiazide) 50 mg twice daily    ---    * Hydrocodone-Acetaminophen 5-325 MG Tablet 1-2 tablet as needed Orally every 6 hrs    ---    * Metformin 500 mg 1 tab Oral twice daily    ---    * Pantoprazole Sodium 40 MG Tablet Delayed Release 1 tablet Orally twice daily    ---    * Provigil 100 mg Tablet 3 tablet in the morning Orally Once a day    ---    * Simvastatin 80 MG Tablet 1 tablet in the evening Orally Once a day    ---    * Vitamin D  once a day    ---    * Medication List reviewed and reconciled with the patient    ---      Past Medical History    ---       High cholesterol.        ---    Hypertention.        ---    NIDDM.        ---    GERD.        ---    Asthma.        ---      Surgical History    ---      mastectomy  for breast CA 2004    ---      Family History    ---      Mother: alive, diagnosed with Diabetes, Hypertension, Heart Disease    ---    Father: alive, diagnosed with Diabetes, Hypertension, Heart Disease    ---    Non-Contributory    ---      Social History    ---    Tobacco  history: Never smoked.    Work/Occupation: employed full-time, Engineer, site. [name]    Alcohol  Yes but rarely.   Lives at home independently, recently started using  a cane to ambulate.    ---      Allergies    ---      aspirin    ---    NSAIDs    ---      Review of Systems    ---    Per HPI, otherwise negative.      Vital Signs    ---    Pain scale 4.      Examination    ---     _GENERAL_ :    Upright lumbar spine x-rays were reviewed. Lumbar lordosis is preserved. All  hardware including interbody device is in good position without evidence of  failure.          Physical Examination    ---    Patient is well-nourished, well-appearing and in no distress. Ambulates with  use of a cane in the office today. Midline lumbar insision is clean dry and  intact with nylon sutures. No surrounding erythema or drainage. 5/5 strength  in iliopsoas, quads, tibialis anterior, EHL and gastroc muscle groups.  Sensation is intact to light touch in the L3-S1 nerve distributions  bilaterally. Extremities are warm and well-perfused without any peripheral  edema.      Assessments    ---    1\. Lumbar radiculopathy, right - M54.16 (Primary)    ---    2\. 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 S/P lumbar fusion - Z98.1    ---      Treatment    ---       **1\. Lumbar radiculopathy, right**    Notes: All findings were reviewed  with Kathy Morales in the office today. She is  very happy with the early results of her surgery as her leg pain is completely  resolved. We reassured her that back pain is to be expected at this point  after surgery and she does think it is getting better each day. We provided  her with a refill of Dilaudid (#90) for her trip to [place] this week. I  emphasized that she needs to get up and walk on the plane frequently to  prevent blood clots and to help with her back pain. She can also use ice packs  on the plane. I reminded her of her postop lifting and twisting restrictions.  I will see her back in the office in 4 weeks for routine follow up with lumbar  x-rays. All questions were answered and she agrees with the plan.    ---      Follow Up    ---    4 Weeks with x-rays    Electronically signed by [name] , MD on 05/26/2016 at 09:30 AM EST    Sign off status: Completed        * * *  [facility]    [address]    [place], [place] [address]    Tel: [phone]    Fax: [phone]              * * *          Patient: Kathy Morales, Kathy Morales DOB: 1955/11/02 Progress Note: [name], MD  05/21/2016    ---    Note generated by eClinicalWorks EMR/PM Software ([email or web address])

## 2016-06-10 ENCOUNTER — Ambulatory Visit: Admitting: Orthopaedic Surgery

## 2016-06-10 NOTE — Progress Notes (Signed)
* * *        **  Kathy Morales**    --- ---    51 Y old Female, DOB: 1955-06-10    [address] [place], [place] [address]    Home: [phone]    Provider: [name]        * * *    Telephone Encounter    ---    Answered by   Ellie Lunch  Date: 06/10/2016         Time: 12:26 PM    Caller   patient    --- ---            Reason   medication            Message                      medication                Action Taken   patient left voicemail requesting dilaudid, taking one at  bedtime. Called back twice and left voicemail for patient. Reviewed masspat,  PCP prescribed #120 tablets which was filled on 06/09/16. Waiting for callback  from patient. HEALY,BRETT , PA 06/10/2016 12:28:17 PM >                * * *                ---          * * *          Patient: Kathy Morales, Kathy Morales DOB: July 03, 1955 Provider: [name]  06/10/2016    ---    Note generated by eClinicalWorks EMR/PM Software ([email or web address])

## 2016-06-21 ENCOUNTER — Ambulatory Visit: Admit: 2016-06-21 | Payer: HMO

## 2016-06-21 ENCOUNTER — Ambulatory Visit: Admitting: Orthopaedic Surgery

## 2016-06-21 NOTE — Progress Notes (Signed)
* * *        **Kathy Morales**    --- ---    42 Y old Female, DOB: May 31, 1956, External MRN: [id number]    Account Number: [id number]    [address] [name], [id number]    Home: [phone]    Insurance: HMO BLUE OUT [employer]    PCP: Rella Larve Referring: Rella Larve    [facility]        * * *    06/21/2016  Progress Notes: [name], MD **CHN#:** [phone]    --- ---    ---        Reason for Appointment    ---      1\. s/p L4-5 TLIF on 05/06/16    ---      History of Present Illness    ---     _GENERAL_ Kathy Morales is here today for her second postop follow up. Her leg pain is still  completely resolved. She has back pain and stiffness but the pain is  controlled with 2 tabs of Vicodin BID. She has returned to work and finds that  the her back pain is worse with sitting or standing for long periods of time.  It is better with repositioning. She traveled to [place] since I last saw her  and had no real set backs with that trip. No fever/chills or wound drainage,  no chest pain or shortness of breath.      Current Medications    ---    Taking     * Albuterol Sulfate 108 (90 Base) MCG/ACT Aerosol Powder Breath Activated 2 puffs as needed Inhalation every 6 hrs    ---    * Ativan 1 MG Tablet 1 tablet at bedtime as needed Orally Once a day    ---    * Calcium 1 tab Oral daily    ---    * Dilaudid     ---    * Fish Oil (omega-3 fatty acids)     ---    * flovent 100MG  3 tabs twice daily    ---    * HCTZ (hydrochlorothiazide) 50 mg twice daily    ---    * Hydrocodone-Acetaminophen 5-325 MG Tablet 1-2 tablet as needed Orally every 6 hrs    ---    * Metformin 500 mg 1 tab Oral twice daily    ---    * Pantoprazole Sodium 40 MG Tablet Delayed Release 1 tablet Orally twice daily    ---    * Provigil 100 mg Tablet 3 tablet in the morning Orally Once a day    ---    * Simvastatin 80 MG Tablet 1 tablet in the evening Orally Once a day    ---    * Vitamin D once a day    ---    *  Medication List reviewed and reconciled with the patient    ---      Past Medical History    ---       High cholesterol.        ---    Hypertention.        ---    NIDDM.        ---    GERD.        ---    Asthma.        ---      Surgical History    ---  mastectomy for breast CA 2004    ---      Family History    ---      Mother: alive, diagnosed with Diabetes, Hypertension, Heart Disease    ---    Father: alive, diagnosed with Diabetes, Hypertension, Heart Disease    ---    Non-Contributory    ---      Social History    ---    Tobacco  history: Never smoked.    Work/Occupation: employed full-time, Engineer, site. [name]    Alcohol  Yes but rarely.   Lives at home independently with her husband. No  longer using a cane to ambulate.    ---      Allergies    ---      aspirin    ---    NSAIDs    ---      Review of Systems    ---    Per HPI, otherwise negative.      Vital Signs    ---    Pain scale 2.      Examination    ---     _GENERAL_ :    Upright lumbar spine x-rays were reviewed. Lumbar lordosis is preserved. All  hardware including interbody device is in good position without evidence of  failure. No interval change from preop.          Physical Examination    ---    Patient is well-nourished, well-appearing and in no distress. Ambulates  independently in the office today. Midline lumbar insision is clean dry and  intact. No surrounding erythema or drainage. 5/5 strength in iliopsoas, quads,  tibialis anterior, EHL and gastroc muscle groups. Sensation is intact to light  touch in the L3-S1 nerve distributions bilaterally. Extremities are warm and  well-perfused without any peripheral edema.      Assessments    ---    1\. Lumbar radiculopathy, right - M54.16 (Primary)    ---    2\. 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 S/P lumbar fusion - Z98.1    ---      Treatment    ---       **1\. Lumbar radiculopathy, right**    Notes: All findings were reviewed with  Ranetta in the office today. She is  still very pleased with the results of her surgery and has had no recurrence  of leg pain. Her back pain continues to slowly improve. We will hold off on  any PT at this point and likely get her back into outpatient PT at her next  visit when hopefully her back pain is improved. She can take one additional  dose of Tylenol midday if she needs it. I will see her back in the office in 6  weeks for routine follow up with lumbar x-rays. All questions were answered  and she agrees with the plan. She knows to call in the meantime with any new  concners.    ---      Follow Up    ---    6 Weeks with x-rays    Electronically signed by [name] , MD on 06/21/2016 at 10:39 AM EST    Sign off status: Completed        * * *        [facility]    [address]    [place], [place] [address]    Tel: [phone]  Fax: [phone]              * * *          Patient: Kathy Morales, Kathy Morales DOB: [birth year] Progress Note: [name], MD  06/21/2016    ---    Note generated by eClinicalWorks EMR/PM Software ([email or web address])

## 2016-06-21 NOTE — Progress Notes (Signed)
.  Progress Notes  .  Patient: Kathy Morales, Kathy Morales  Provider: [name]    .  DOB: 1956-04-09 Age: 61 Y Sex: Female  .  PCP: Rella Larve   Date: 06/21/2016  .  --------------------------------------------------------------------------------  .  REASON FOR APPOINTMENT  .  1. s/p L4-5 TLIF on 05/06/16  .  HISTORY OF PRESENT ILLNESS  .  GENERAL:   Kathy Morales is here today for her second postop follow up. Her leg  pain is still completely resolved. She has back pain and  stiffness but the pain is controlled with 2 tabs of Vicodin BID.  She has returned to work and finds that the her back pain is  worse with sitting or standing for long periods of time. It is  better with repositioning. She traveled to [place] since I last saw  her and had no real set backs with that trip. No fever/chills or  wound drainage, no chest pain or shortness of breath.  .  CURRENT MEDICATIONS  .  Taking Albuterol Sulfate 108 (90 Base) MCG/ACT Aerosol Powder  Breath Activated 2 puffs as needed Inhalation every 6 hrs  Taking Ativan 1 MG Tablet 1 tablet at bedtime as needed Orally  Once a day  Taking Calcium 1 tab Oral daily  Taking Dilaudid  Taking Fish Oil (omega-3 fatty acids)  Taking flovent 100MG  3 tabs twice daily  Taking HCTZ (hydrochlorothiazide) 50 mg twice daily  Taking Hydrocodone-Acetaminophen 5-325 MG Tablet 1-2 tablet as  needed Orally every 6 hrs  Taking Metformin 500 mg 1 tab Oral twice daily  Taking Pantoprazole Sodium 40 MG Tablet Delayed Release 1 tablet  Orally twice daily  Taking Provigil 100 mg Tablet 3 tablet in the morning Orally Once  a day  Taking Simvastatin 80 MG Tablet 1 tablet in the evening Orally  Once a day  Taking Vitamin D once a day  Medication List reviewed and reconciled with the patient  .  PAST MEDICAL HISTORY  .  High cholesterol  Hypertention  NIDDM  GERD  Asthma  .  ALLERGIES  .  aspirin  NSAIDs  .  SURGICAL HISTORY  .  mastectomy for breast CA 2004  .  FAMILY HISTORY  .  Mother: alive, diagnosed with  Diabetes, Hypertension, Heart  Disease  Father: alive, diagnosed with Diabetes, Hypertension, Heart  Disease  Non-Contributory  .  SOCIAL HISTORY  .  .  Tobaccohistory:Never smoked.  .  Work/Occupation: employed full-time, Engineer, site.  .  .  Alcohol Yes but rarely.  .  Lives at home independently with her husband. No longer using a  cane to ambulate.  [name]  REVIEW OF SYSTEMS  .  Per HPI, otherwise negative.  [name]  VITAL SIGNS  .  Pain scale 2.  .  EXAMINATION  .  GENERAL: Upright lumbar spine x-rays were reviewed.  Lumbar lordosis is preserved. All hardware including interbody  device is in good position without evidence of failure. No  interval change from preop.  .  PHYSICAL EXAMINATION  .  Patient is well-nourished, well-appearing and in no distress.  Ambulates independently in the office today. Midline lumbar  insision is clean dry and intact. No surrounding erythema or  drainage. 5/5 strength in iliopsoas, quads, tibialis anterior,  EHL and gastroc muscle groups. Sensation is intact to light touch  in the L3-S1 nerve distributions bilaterally. Extremities are  warm and well-perfused without any peripheral edema.  .  ASSESSMENTS  .  Lumbar radiculopathy,  right - M54.16 (Primary)  .  Spinal stenosis of lumbar region with neurogenic claudication -  M48.062  .  S/P lumbar fusion - Z98.1  .  TREATMENT  .  Lumbar radiculopathy, right  Notes: All findings were reviewed with Ondrea in the office  today. She is still very pleased with the results of her surgery  and has had no recurrence of leg pain. Her back pain continues to  slowly improve. We will hold off on any PT at this point and  likely get her back into outpatient PT at her next visit when  hopefully her back pain is improved. She can take one additional  dose of Tylenol midday if she needs it. I will see her back in  the office in 6 weeks for routine follow up with lumbar x-rays.  All questions were answered and she agrees with the plan. She  knows  to call in the meantime with any new concners.  .  FOLLOW UP  .  6 Weeks with x-rays  .  Electronically signed by [name] , MD on  06/21/2016 at 10:39 AM EST  .  Document electronically signed by [name]    .

## 2016-08-23 ENCOUNTER — Ambulatory Visit: Admit: 2016-08-23 | Payer: HMO

## 2016-08-23 ENCOUNTER — Ambulatory Visit: Admitting: Orthopaedic Surgery

## 2016-08-23 ENCOUNTER — Ambulatory Visit: Admitting: Surgical

## 2016-08-23 NOTE — Progress Notes (Signed)
* * *        **Kathy Morales**    --- ---    77 Y old Female, DOB: [birth year], External MRN: [id number]    Account Number: [id number]    [address] [name], [id number]    Home: [phone]    Insurance: HMO [place] [employer]    PCP: Rella Larve Referring: Rella Larve    Appointment Facility: [facility]        * * *    08/23/2016   **Appointment Provider:** Ellie Lunch, PA **CHN#:** [phone]    --- ---      **Supervising Provider:** [name], MD    ---        Reason for Appointment    ---      1\. 3 months s/p L4-5 TLIF on 05/06/16    ---      History of Present Illness    ---     _GENERAL_ :    [name] is here today for 3 month follow-up s/p L4-L5 TLIF. She was doing  extremely well in early March and became more active at that time with  horseback riding, travel and she recently painted her garage. Since then she  has experienced occasional low back pain, aching sensation and occasional  right buttock pain. She also admits to bilateral, R>L, greater trochanteric  pain, worse with lying on her sides. No radiating leg pain/numbness or  tingling. She denies any new fever, chills, bowel/bladder change.      Current Medications    ---    Taking     * Albuterol Sulfate 108 (90 Base) MCG/ACT Aerosol Powder Breath Activated 2 puffs as needed Inhalation every 6 hrs    ---    * Ativan 1 MG Tablet 1 tablet at bedtime as needed Orally Once a day    ---    * Calcium 1 tab Oral daily    ---    * Fish Oil (omega-3 fatty acids)     ---    * flovent 100MG  3 tabs twice daily    ---    * HCTZ (hydrochlorothiazide) 50 mg twice daily    ---    * Metformin 500 mg 1 tab Oral twice daily    ---    * Pantoprazole Sodium 40 MG Tablet Delayed Release 1 tablet Orally twice daily    ---    * Provigil 100 mg Tablet 3 tablet in the morning Orally Once a day    ---    * Simvastatin 80 MG Tablet 1 tablet in the evening Orally Once a day    ---    * Vitamin D once a day    ---    * Medication List reviewed and reconciled  with the patient    ---      Past Medical History    ---       High cholesterol.        ---    Hypertention.        ---    NIDDM.        ---    GERD.        ---    Asthma.        ---      Surgical History    ---      mastectomy for breast CA 2004    ---    L4-L5 TLIF [facility] Dr. Charlton Amor 05/06/16    ---  Family History    ---      Mother: alive, diagnosed with Diabetes, Hypertension, Heart Disease    ---    Father: alive, diagnosed with Diabetes, Hypertension, Heart Disease    ---      Social History    ---    Tobacco    history: _Never smoked_    Work/Occupation: employed full-time, Engineer, site. [name]    Alcohol    _Yes but rarely_   Lives at home independently with her husband. No longer  using a cane to ambulate.    ---      Allergies    ---      aspirin    ---    NSAIDs    ---      Hospitalization/Major Diagnostic Procedure    ---      No Hospitalization History.    ---      Review of Systems    ---    As per HPI.      Vital Signs    ---    Pain scale 0.      Examination    ---     _GENERAL_ :    Lumbar spine radioraphs from today were reviewed. Hardware in good postion as  well as interbody. No evidence of failure. Alignment maintained.          Physical Examination    ---    Patient is well-nourished, well-appearing and in no distress. Ambulates  independently in the office today. Midline lumbar insision is well healed. No  surrounding erythema or drainage. 5/5 strength in iliopsoas, quads, tibialis  anterior, EHL and gastroc muscle groups. Sensation is intact to light touch in  the L3-S1 nerve distributions bilaterally. Extremities are warm and well-  perfused without any peripheral edema.      Assessments    ---    1\. S/P lumbar fusion - Z98.1 (Primary)    ---    2\. 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 S/P lumbar fusion**    Notes: All findings and imaging were discussed with [name]. She continues  to do well post-operatively and is  happy with her surgical results. At this  time she has no further restrictions. We recommend she begins out-patient PT  for bilateral greater trochanteric bursa and intermittent low back pain. We  also recommended a cortisone injection today which she would like to proceed  with. Procedure tolerated well. Patient can follow-up in three months for  repeat lumbar spine x-rays and re-evaluation. All questions were answered.    ---      Procedures    ---    Under sterile technique, the right greater trochanteric bursa was injected  today at the point of maximal tenderness with 2 cc Celestone and 3 cc  bupivacaine. A bandaid was applied and the usual post injection instructions  were given. (20610).      Follow Up    ---    3 months with lumbar spine AP/LAT xrays    **Appointment Provider:** Ellie Lunch, PA    Electronically signed by [name] , MD on 08/23/2016 at 11:14 AM EDT    Sign off status: Completed        * * *        [facility]    [address]    [place], [place] [address]  Tel: [phone]    Fax: [phone]              * * *          Patient: Kathy Morales, Kathy Morales DOB: [birth year] Progress Note: Ellie Lunch, PA  08/23/2016    ---    Note generated by eClinicalWorks EMR/PM Software ([email or web address])

## 2016-08-23 NOTE — Progress Notes (Signed)
.  Progress Notes  .  Patient: Kathy Morales  Provider: Ellie Lunch  PA  .  DOB: 12/06/1955 Age: 61 Y Sex: Female  Supervising Provider:: [name], MD  Date: 08/23/2016  .  PCP: Rella Larve   Date: 08/23/2016  .  --------------------------------------------------------------------------------  .  REASON FOR APPOINTMENT  .  1. 3 months s/p L4-5 TLIF on 05/06/16  .  HISTORY OF PRESENT ILLNESS  .  GENERAL:  Kathy Morales is here today for 3 month  follow-up s/p L4-L5 TLIF. She was doing extremely well in early  March and became more active at that time with horseback riding,  travel and she recently painted her garage. Since then she has  experienced occasional low back pain, aching sensation and  occasional right buttock pain. She also admits to bilateral, R>L,  greater trochanteric pain, worse with lying on her sides. No  radiating leg pain/numbness or tingling. She denies any new  fever, chills, bowel/bladder change.  .  CURRENT MEDICATIONS  .  Taking Albuterol Sulfate 108 (90 Base) MCG/ACT Aerosol Powder  Breath Activated 2 puffs as needed Inhalation every 6 hrs  Taking Ativan 1 MG Tablet 1 tablet at bedtime as needed Orally  Once a day  Taking Calcium 1 tab Oral daily  Taking Fish Oil (omega-3 fatty acids)  Taking flovent 100MG  3 tabs twice daily  Taking HCTZ (hydrochlorothiazide) 50 mg twice daily  Taking Metformin 500 mg 1 tab Oral twice daily  Taking Pantoprazole Sodium 40 MG Tablet Delayed Release 1 tablet  Orally twice daily  Taking Provigil 100 mg Tablet 3 tablet in the morning Orally Once  a day  Taking Simvastatin 80 MG Tablet 1 tablet in the evening Orally  Once a day  Taking Vitamin D once a day  Medication List reviewed and reconciled with the patient  .  PAST MEDICAL HISTORY  .  High cholesterol  Hypertention  NIDDM  GERD  Asthma  .  ALLERGIES  .  aspirin  NSAIDs  .  SURGICAL HISTORY  .  mastectomy for breast CA 2004  L4-L5 TLIF [facility] Dr. Charlton Amor 05/06/16  .  FAMILY HISTORY  .  Mother: alive,  diagnosed with Diabetes, Hypertension, Heart  Disease  Father: alive, diagnosed with Diabetes, Hypertension, Heart  Disease  .  SOCIAL HISTORY  .  .  Tobacco  history:Never smoked  .  [name]  Work/Occupation: employed full-time, Engineer, site.  .  .  .  Alcohol  Yes but rarely  .  Lives at home independently with her husband. No longer using a  cane to ambulate.  [name]  HOSPITALIZATION/MAJOR DIAGNOSTIC PROCEDURE  .  No Hospitalization History.  [name]  REVIEW OF SYSTEMS  .  As per HPI.  [name]  VITAL SIGNS  .  Pain scale 0.  .  EXAMINATION  .  GENERAL: Lumbar spine radioraphs from today were  reviewed. Hardware in good postion as well as interbody. No  evidence of failure. Alignment maintained.  .  PHYSICAL EXAMINATION  .  Patient is well-nourished, well-appearing and in no distress.  Ambulates independently in the office today. Midline lumbar  insision is well healed. No surrounding erythema or drainage. 5/5  strength in iliopsoas, quads, tibialis anterior, EHL and gastroc  muscle groups. Sensation is intact to light touch in the L3-S1  nerve distributions bilaterally. Extremities are warm and  well-perfused without any peripheral edema.  .  ASSESSMENTS  .  S/P lumbar fusion - Z98.1 (Primary)  .  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  TREATMENT  .  S/P lumbar fusion  Notes: All findings and imaging were discussed with Kathy Morales.  She continues to do well post-operatively and is happy with her  surgical results. At this time she has no further restrictions.  We recommend she begins out-patient PT for bilateral greater  trochanteric bursa and intermittent low back pain. We also  recommended a cortisone injection today which she would like to  proceed with. Procedure tolerated well. Patient can follow-up in  three months for repeat lumbar spine x-rays and re-evaluation.  All questions were answered.  [name]  PROCEDURES  .  Under sterile technique, the right greater  trochanteric bursa was injected today at  the point of maximal  tenderness with 2 cc Celestone and 3 cc bupivacaine. A bandaid  was applied and the usual post injection instructions were given.  (20610).  .  FOLLOW UP  .  3 months with lumbar spine AP/LAT xrays  .  [name]  Appointment Provider: Ellie Lunch, PA  .  Electronically signed by [name] , MD on  08/23/2016 at 11:14 AM EDT  .  CONFIRMATORY SIGN OFF  .  [name]  Document electronically signed by Ellie Lunch  PA  .

## 2016-12-08 ENCOUNTER — Ambulatory Visit

## 2016-12-14 ENCOUNTER — Ambulatory Visit: Admit: 2016-12-14 | Payer: HMO

## 2016-12-14 ENCOUNTER — Ambulatory Visit: Admitting: Internal Medicine

## 2016-12-14 ENCOUNTER — Ambulatory Visit

## 2016-12-14 MED ORDER — Singulair: 10 | Tablet | Freq: Every day | 3 refills | 0 days | Status: AC

## 2016-12-14 MED ORDER — Advair HFA: Inhaler | Freq: Two times a day (BID) | 3 refills | 0 days | Status: AC

## 2016-12-14 MED ORDER — Flonase: 50 | 3 | Freq: Every day | 3 refills | 0 days | Status: AC

## 2016-12-14 NOTE — Progress Notes (Signed)
.  Progress Notes  .  Patient: Kathy Morales  Provider: Otto Herb  MD  .  DOB: 1955-12-09 Age: 61 Y Sex: Female  Supervising Provider:: AMY Minus Breeding, MD  Date: 12/14/2016  .  PCP: Rella Larve   Date: 12/14/2016  .  --------------------------------------------------------------------------------  .  REASON FOR APPOINTMENT  .  1. FR. EMAIL  .  HISTORY OF PRESENT ILLNESS  .  GENERAL:   PT is a pleasant 61 F with longstanding history of asthma as a  child (which er patient went away) who presents with chronic  cough and wheezing and "asthma exacerbations" since 2014.Patient  reports during her her childhood she had asthma and was treated  with inhalers iften until she felt her asthma attacks grew  further apart. She then reports in summer 2014 she went to  [place] where she got a lung infection, with symptoms of fevers,  cough and dyspnea. At that time she did not go to a hspital, but  upon returning home felt every 6 weeks or so she got flares of  dyspnea and cough. OVer this time she got a few bursts of  prednisone. Her PCP during this time advised her to see a  pulmonolgist, and she was started on a SABA nad possibly  floventPatient reports that he cough and dyspnea symptoms became  less frequent and she went to a trip to [place] MArch 2018. She  reports since this trip she has had hoarse voice, intence  coughing, and another episode of fevers (checked temp). She  reports sicne trip she required at least two more prednisone  bursts and tapers (now on 20 mg from most recent burst of 60 mg a  few days ago). She reports her PCP has given her trials of abx to  no effect. She reports she feels post nasal drip, has a barking  cough, and is wheezy. She endorses diffuse skin redness and  itchiness as well. She reports feeling warm sometimes with cough.  .  CURRENT MEDICATIONS  .  Taking Albuterol Sulfate 108 (90 Base) MCG/ACT Aerosol Powder  Breath Activated 2 puffs as needed Inhalation every 6 hrs  Taking Ativan 1  MG Tablet 1 tablet at bedtime as needed Orally  Once a day  Taking Calcium 1 tab Oral daily  Taking Fish Oil (omega-3 fatty acids)  Taking flovent 100MG  3 tabs twice daily  Taking HCTZ (hydrochlorothiazide) 50 mg twice daily  Taking Metformin 500 mg 1 tab Oral twice daily  Taking Pantoprazole Sodium 40 MG Tablet Delayed Release 1 tablet  Orally twice daily  Taking Provigil 100 mg Tablet 3 tablet in the morning Orally Once  a day  Taking Simvastatin 80 MG Tablet 1 tablet in the evening Orally  Once a day  Taking Vitamin D once a day  .  PAST MEDICAL HISTORY  .  High cholesterol  Hypertention  NIDDM  GERD  Asthma  .  ALLERGIES  .  aspirin  NSAIDs  .  SURGICAL HISTORY  .  mastectomy for breast CA 2004  L4-L5 TLIF [facility] Dr. Charlton Amor 05/06/16  .  FAMILY HISTORY  .  Mother: alive, diagnosed with Diabetes, Hypertension, Heart  Disease  Father: alive, diagnosed with Diabetes, Hypertension, Heart  Disease  per pt no fh of asthma (save for husband).  .  SOCIAL HISTORY  .  .  Tobacco  history:Never smoked  .  [name]  Work/Occupation: employed full-time, Engineer, site.  .  .  .  Alcohol  Yes but rarely  .  Lives at home independently with her husband. No longer using a  cane to ambulate. Never smoker. rare etoh use. No illicits. No  exposure to mold. No pets (formerly had a cat). No hcange in  housing recnetly, most recent travel to [place] in 07/2016.  [name]  REVIEW OF SYSTEMS  .  ADULT Pulmonary:  .  Constitutional    rare fever as above . Respiratory    as per HPI  . Cardiovascular    no chest pain, palpitations .  Gastrointestinal    no heartburn, reflux . Musculoskeletal    no  joint pain . Psychological    no depression, anxiety . Skin    no  rash .  [name]  VITAL SIGNS  .  Pain scale 0, Ht-in 63.6, Wt-lbs 182, BMI 31.63, BP 121/81, HR  118, RR 16, Temp 98.7, Oxygen sat % 96RA.  [name]  EXAMINATION  .  GENERAL:  GeneralWell-appearing, mild distress, no visible wheezing or  tripoding or difficulty breathing .  [name]  EyesPupils equal,  Round, Full range of motion, No sclera icteris,  non-injected .  [name]  Ears, Nose, Mouth and Throatposterrior nasopharyngeal erythema,  inferior turbinate hypertrophy and erythema.  .  NeckThyroid normal to palpation, No carotid bruits .  [name]  Respiratoryb/l wheezing.  .  CardiovascularRRR S1 nd S2 ausculted at LUSb and RUSB. No HJR. NO  JVP elevation .  [name]  Lymph nodesNo cervical lymphadenopathy .  [name]  Musculoskeletal / NeurologicalNormal gait, no atrophy of the  lower or upper extremities, full range of motion of lower and  upper extremities, Good proximal strength, DTRs 2+ with normal  relaxation phase .  [name]  SkinNo telangectasias, tatoos, or rashes, no nodules, no rashes  or nail changes .  [name]  PHYSICAL EXAMINATION  .  PFTS: fev1/fvc .66, FEV1 71 % pred, FVC 83 % pred, post FEV1 and  FVC chnaged +15 and 13 %, postive bronchodilator response. Mild  obstruction with bronchodilator response.  .  ASSESSMENTS  .  Asthma - J45.909 (Primary)  .  Cough - R05  .  Breast CA - C50.919  .  Acsa Estey is a pleasant 61 F coming in with complaint of  onging hoarsenss, post nasal drip, and cough and wheezingthat is  consistent with asthma (confirmed with history and PFTs today).  At this time her symptoms are very poorly controlled on only SABA  and flovent. Chagned regimen to SABA prn, Advair 230/21 2 puffs  bid, singular and flonase. In addition, given patient's  descirption of symptoms starting after possible infection in  [place] in 2014, as well as history breast cancer R breast s/p  masectomy, will get CT chets to r/o bronchiectasis or other  process. Will have patient f/u n clinic in 6-8 weeks with repeat  PFTs with volumes as well.  .  TREATMENT  .  Asthma  Start Advair HFA Aerosol, 230-21 MCG/ACT, 2 puffs, Inhalation,  Twice a day, 90 days, 3 Inhaler, Refills 3  Start Singulair Tablet, 10 mg, 1 tablet, Orally, Once a day, 90  days, 90 Tablet, Refills 3  Start Flonase Suspension, 50 MCG/ACT, 1 spray in each nostril,  Nasally,  Once a day, 90 days, 3, Refills 3  CT [id number]  Pulmonary Function Test  Notes: F/U in 6 weeks with pfts with lung volumes in [facility]  ordered to be done before next appointment. reason: asthma, r/o  ild. No smoking history.  [name]  FOLLOW UP  .  6 Weeks  .  [name]  Appointment Provider: Otto Herb, MD  .  Electronically signed by Sydnee Levans , MD on  02/01/2017 at 03:12 PM EDT  .  CONFIRMATORY SIGN OFF  I personally interviewed and examined the patient and both the fellow and I contributed to this electronic note. I agree with the history, exam, assessment and plan as detailed in this note and edited it as necessary.  .  Document electronically signed by Otto Herb  MD  .

## 2016-12-14 NOTE — Progress Notes (Signed)
* * *        **Kathy Morales**    --- ---    28 Y old Female, DOB: August 23, 1955, External MRN: [id number]    Account Number: [id number]    [address] [name], [id number]    Home: [phone]    Insurance: [employer]    PCP: Rella Larve Referring: Rella Larve    Appointment Facility: Pulmonology        * * *    12/14/2016   **Appointment Provider:** Otto Herb, MD **CHN#:** [id number]    --- ---      **Supervising Provider:** AMY Minus Breeding, MD    ---        Reason for Appointment    ---      1\. Ivonne Andrew    ---      History of Present Illness    ---     _GENERAL_ :    PT is a pleasant 17 F with longstanding history of asthma as a child (which er  patient went away) who presents with chronic cough and wheezing and "asthma  exacerbations" since 2014.    Patient reports during her her childhood she had asthma and was treated with  inhalers iften until she felt her asthma attacks grew further apart. She then  reports in summer 2014 she went to [place] where she got a lung infection,  with symptoms of fevers, cough and dyspnea. At that time she did not go to a  hspital, but upon returning home felt every 6 weeks or so she got flares of  dyspnea and cough. OVer this time she got a few bursts of prednisone. Her PCP  during this time advised her to see a pulmonolgist, and she was started on a  SABA nad possibly flovent    Patient reports that he cough and dyspnea symptoms became less frequent and  she went to a trip to [place] MArch 2018. She reports since this trip she has  had hoarse voice, intence coughing, and another episode of fevers (checked  temp). She reports sicne trip she required at least two more prednisone bursts  and tapers (now on 20 mg from most recent burst of 60 mg a few days ago). She  reports her PCP has given her trials of abx to no effect. She reports she  feels post nasal drip, has a barking cough, and is wheezy. She endorses  diffuse skin redness and itchiness as well. She reports  feeling warm sometimes  with cough.      Current Medications    ---    Taking     * Albuterol Sulfate 108 (90 Base) MCG/ACT Aerosol Powder Breath Activated 2 puffs as needed Inhalation every 6 hrs    ---    * Ativan 1 MG Tablet 1 tablet at bedtime as needed Orally Once a day    ---    * Calcium 1 tab Oral daily    ---    * Fish Oil (omega-3 fatty acids)     ---    * flovent 100MG  3 tabs twice daily    ---    * HCTZ (hydrochlorothiazide) 50 mg twice daily    ---    * Metformin 500 mg 1 tab Oral twice daily    ---    * Pantoprazole Sodium 40 MG Tablet Delayed Release 1 tablet Orally twice daily    ---    * Provigil 100 mg Tablet 3  tablet in the morning Orally Once a day    ---    * Simvastatin 80 MG Tablet 1 tablet in the evening Orally Once a day    ---    * Vitamin D once a day    ---      Past Medical History    ---       High cholesterol.        ---    Hypertention.        ---    NIDDM.        ---    GERD.        ---    Asthma.        ---      Surgical History    ---      mastectomy for breast CA 2004    ---    L4-L5 TLIF [facility] Dr. Charlton Amor 05/06/16    ---      Family History    ---      Mother: alive, diagnosed with Diabetes, Hypertension, Heart Disease    ---    Father: alive, diagnosed with Diabetes, Hypertension, Heart Disease    ---    per pt no fh of asthma (save for husband).    ---      Social History    ---    Tobacco    history: _Never smoked_    Work/Occupation: employed full-time, Engineer, site. [name]    Alcohol    _Yes but rarely_   Lives at home independently with her husband. No longer  using a cane to ambulate. Never smoker. rare etoh use. No illicits. No  exposure to mold. No pets (formerly had a cat). No hcange in housing recnetly,  most recent travel to [place] in 07/2016.    ---      Allergies    ---      aspirin    ---    NSAIDs    ---      Review of Systems    ---     _ADULT Pulmonary_ :    Constitutional rare fever as above. Respiratory as per HPI. Cardiovascular no  chest pain,  palpitations. Gastrointestinal no heartburn, reflux.  Musculoskeletal no joint pain. Psychological no depression, anxiety. Skin no  rash.          Vital Signs    ---    Pain scale 0, Ht-in 63.6, Wt-lbs 182, BMI 31.63, BP 121/81, HR 118, RR 16,  Temp 98.7, Oxygen sat % 96RA.      Examination    ---     _GENERAL_ :    General Well-appearing, mild distress, no visible wheezing or tripoding or  difficulty breathing .    Eyes Pupils equal, Round, Full range of motion, No sclera icteris, non-  injected .    Ears, Nose, Mouth and Throat posterrior nasopharyngeal erythema, inferior  turbinate hypertrophy and erythema.    Neck Thyroid normal to palpation, No carotid bruits .    Respiratory b/l wheezing.    Cardiovascular RRR S1 nd S2 ausculted at LUSb and RUSB. No HJR. NO JVP  elevation  .    Lymph nodes No cervical lymphadenopathy .    Musculoskeletal / Neurological Normal gait, no atrophy of the lower or upper  extremities, full range of motion of lower and upper extremities, Good  proximal strength, DTRs 2+ with normal relaxation phase .    Skin No telangectasias, tatoos, or rashes, no nodules, no  rashes or nail  changes .          Physical Examination    ---    PFTS: fev1/fvc .66, FEV1 71 % pred, FVC 83 % pred, post FEV1 and FVC chnaged  +15 and 13 %, postive bronchodilator response. Mild obstruction with  bronchodilator response.      Assessments    ---    1\. Asthma - J45.909 (Primary)    ---    2\. Cough - R05    ---    3\. Breast CA - C50.919    ---      Kathy Morales is a pleasant 67 F coming in with complaint of onging  hoarsenss, post nasal drip, and cough and wheezingthat is consistent with  asthma (confirmed with history and PFTs today). At this time her symptoms are  very poorly controlled on only SABA and flovent. Chagned regimen to SABA prn,  Advair 230/21 2 puffs bid, singular and flonase. In addition, given patient's  descirption of symptoms starting after possible infection in [place] in 2014,  as well as  history breast cancer R breast s/p masectomy, will get CT chets to  r/o bronchiectasis or other process. Will have patient f/u n clinic in 6-8  weeks with repeat PFTs with volumes as well.    ---      Treatment    ---       **1\. Asthma**    Start Advair HFA Aerosol, 230-21 MCG/ACT, 2 puffs, Inhalation, Twice a day, 90  days, 3 Inhaler, Refills 3    Start Singulair Tablet, 10 mg, 1 tablet, Orally, Once a day, 90 days, 90  Tablet, Refills 3    Start Flonase Suspension, 50 MCG/ACT, 1 spray in each nostril, Nasally, Once a  day, 90 days, 3, Refills 3    _IMAGING: CT Chest_    _PROCEDURE: Pulmonary Function Test_    Notes: F/U in 6 weeks with pfts with lung volumes in am    HRCT ordered to be done before next appointment. reason: asthma, r/o ild. No  smoking history.    ---      Follow Up    ---    6 Weeks    **Appointment Provider:** Otto Herb, MD    Electronically signed by Sydnee Levans , MD on 02/01/2017 at 03:12 PM EDT    Sign off status: Completed        * * *        Pulmonology    [address]    [place], [place] [address]    Tel: [phone]    Fax: [phone]              * * *          Patient: Kathy Morales, Kathy Morales DOB: 07-22-1955 Progress Note: Otto Herb, MD  12/14/2016    ---    Note generated by eClinicalWorks EMR/PM Software ([email or web address])

## 2017-01-11 ENCOUNTER — Ambulatory Visit: Admit: 2017-01-11 | Payer: HMO

## 2017-01-11 ENCOUNTER — Ambulatory Visit

## 2017-01-14 ENCOUNTER — Ambulatory Visit

## 2017-02-01 ENCOUNTER — Ambulatory Visit: Admit: 2017-02-01 | Payer: HMO

## 2017-02-01 ENCOUNTER — Ambulatory Visit

## 2017-02-01 LAB — HX HEM-ROUTINE
HX BASO #: 0 10*3/uL (ref 0.0–0.2)
HX BASO: 1 %
HX EOSIN #: 0.1 10*3/uL (ref 0.0–0.5)
HX EOSIN: 1 %
HX HCT: 41 % (ref 32.0–45.0)
HX HGB: 14.2 g/dL (ref 11.0–15.0)
HX IMMATURE GRANULOCYTE#: 0 10*3/uL (ref 0.0–0.1)
HX IMMATURE GRANULOCYTE: 0 %
HX LYMPH #: 2.6 10*3/uL (ref 1.0–4.0)
HX LYMPH: 45 %
HX MCH: 29.6 pg (ref 26.0–34.0)
HX MCHC: 34.6 g/dL (ref 32.0–36.0)
HX MCV: 85.6 fL (ref 80.0–98.0)
HX MONO #: 0.5 10*3/uL (ref 0.2–0.8)
HX MONO: 9 %
HX MPV: 10.1 fL (ref 9.1–11.7)
HX NEUT #: 2.5 10*3/uL (ref 1.5–7.5)
HX PLT: 229 10*3/uL (ref 150–400)
HX RBC BLOOD COUNT: 4.79 M/uL (ref 3.70–5.00)
HX RDW: 12.6 % (ref 11.5–14.5)
HX SEG NEUT: 44 %
HX WBC: 5.7 10*3/uL (ref 4.0–11.0)

## 2017-02-01 LAB — HX IMMUNOLOGY: HX IMMUNOGLOBULIN G: 894 mg/dL (ref 540–1822)

## 2017-02-01 LAB — HX ASTHMA - PFTS

## 2017-02-01 MED ORDER — Azithromycin: 500 | 24 | 0 refills | 0 days | Status: AC

## 2017-02-01 NOTE — Progress Notes (Signed)
.  Progress Notes  .  Patient: Kathy Morales, Kathy Morales  Provider: Sydnee Levans R   .  DOB: 09-02-1955 Age: 61 Y Sex: Female  Supervising Provider:: AMY Minus Breeding, MD  Date: 02/01/2017  .  PCP: Rella Larve   Date: 02/01/2017  .  --------------------------------------------------------------------------------  .  REASON FOR APPOINTMENT  .  1. Asthma  .  HISTORY OF PRESENT ILLNESS  .  GENERAL:   Kathy Morales is a pleasant 8 F presenting for follow up  regarding asthma and chest CT results. At las visit she presented  with poorly controlled symptoms. She was started on Advair 230-21  2 puffs bid, singulair, flonase, and albuterol prn. Since then  her breathing and hoarsness have improved and she is using her  rescue inhaler much less frequently. She is still bothered by her  cough which remains non-productive. No triggers or pattern to  cough. At times feels like it comes from her throat, at time from  her chest. She has been on PPI bid and recently added H2 blocker  at bedtime. Cough is improved since last visit but remains  problematic for her. Her other concern today is the finding of a  3mm solid RML nodule on her chest CT. Given her hx of breast  cancer (s/p mastectomy 2004) she is concerned about the  possibility of metastatic disease or new primary lung cancer. She  denies recent fevers, chills, night sweats, sore throat, weight  loss, chest pain, flushing, rash, joint aches. PFTs: FEV1 2.09  (LLN 1.8) FVC 3.02 (LLN 2.5) F/F 69 No BD response TLC 5.39 (LLN  5.8) DLCO 20.0 (92% pred) CT Chest: mild airway thickening, 3 mm  solid nodule in RML. No consoloditave or emphasematous changes.  .  CURRENT MEDICATIONS  .  Taking Advair HFA 230-21 MCG/ACT Aerosol 2 puffs Inhalation Twice  a day  Taking Albuterol Sulfate 108 (90 Base) MCG/ACT Aerosol Powder  Breath Activated 2 puffs as needed Inhalation every 6 hrs  Taking Ativan 1 MG Tablet 1 tablet at bedtime as needed Orally  Once a day  Taking Calcium 1 tab Oral daily  Taking  Fish Oil (omega-3 fatty acids)  Taking Flonase 50 MCG/ACT Suspension 1 spray in each nostril  Nasally Once a day  Taking HCTZ (hydrochlorothiazide) 50 mg twice daily  Taking Metformin 500 mg 1 tab Oral twice daily  Taking Pantoprazole Sodium 40 MG Tablet Delayed Release 1 tablet  Orally twice daily  Taking Provigil 100 mg Tablet 3 tablet in the morning Orally Once  a day  Taking Simvastatin 80 MG Tablet 1 tablet in the evening Orally  Once a day  Taking Singulair 10 mg Tablet 1 tablet Orally Once a day  Taking Vitamin D once a day  Not-Taking/PRN flovent 100MG  3 tabs twice daily  Medication List reviewed and reconciled with the patient  .  PAST MEDICAL HISTORY  .  High cholesterol  Hypertention  NIDDM  GERD  Asthma  Breast cancer  .  ALLERGIES  .  aspirin  NSAIDs  .  SURGICAL HISTORY  .  mastectomy for breast CA 2004  L4-L5 TLIF [facility] Dr. Charlton Amor 05/06/16  .  SOCIAL HISTORY  .  .  Tobaccohistory:Never smoked.  .  Work/Occupation: employed full-time, Engineer, site.  .  .  Alcohol Yes but rarely.  .  Lives at home independently with her husband. No longer using a  cane to ambulate. Never smoker. rare etoh use. No illicits. No  exposure to mold. No pets (formerly had a cat). No hcange in  housing recnetly, most recent travel to [place] in 07/2016.  [name]  REVIEW OF SYSTEMS  .  See HPI.  [name]  VITAL SIGNS  .  Pain scale 0, Ht-in 63.6, Wt-lbs 178.3, BMI 30.99, BP 120/90, HR  110, RR 16, Temp 99.0, Oxygen sat % 97%RA.  [name]  EXAMINATION  .  GENERAL:  GeneralWell-appearing, no distress .  EyesPupils equal, Round, Full range of motion, No sclera icteris,  non-injected .  Ears, Nose, Mouth and Throatposterrior nasopharyngeal erythema,  inferior turbinate hypertrophy and erythema.  RespiratoryGood air movement, clear to ausculation, no wheeze or  rhonci, no accessory muscle use .  CardiovascularRRR, nl S1 nd S2, no murmur, , No peripheral edema  .  AbdomenSoft, Nontender, nondistended.  Lymph nodesNo cervical lymphadenopathy  .  Musculoskeletal / NeurologicalNormal gait, no atrophy of the  lower or upper extremities, full range of motion of lower and  upper extremities,.  SkinNo telangectasias, tatoos, or rashes, no nodules, no rashes  or nail changes .  [name]  ASSESSMENTS  .  Asthma - J45.909 (Primary)  .  Pulmonary nodule less than 6 cm determined by computed tomography  of lung - R91.1  .  Cough - R05  .  Kathy Morales is a pleasant 65 F presenting for foolow up for  asthma and CT chest. PFTs confirmed mild obstuctive ventilatory  defect, otherwise normal. Her symptoms are much improved on  ICS/LABA and montelukast. She is using her prn SABA less  frequently. Her cough remains bothersome however we discussed  that she likely needs mor time on current therapies (inhalers,  flonase, PPI) before we would persue further diagnositc testing  such as laryngoscopy or bronchoscopy.We did discuss possible  benefit from addition of azithromycin MWF for antiinflammatory  effects and was agreeable to this. CBC checked today, no evidence  of eosinophilia. Regarding the 3 mm nodule on chest CT, Kathy Morales  was counseled regarding low risk features and absence of evidence  to support repeat imaging. however, given her history of breast  cancer and significant anxiety about possibility of missing  another malignancy, we will obtain repeat CT scan in 6 months. -  Continue Advair 230-21 2 puffs bid- continue singulair daily-  continue albuterol prn- continue flonase - continue PPI, H2  blocker- start Azithromycin 500 mg MWF x8 weeks - repeat CT chest  in 6 months- f/u 6-8 weeks.  .  TREATMENT  .  Asthma  Continue Albuterol Sulfate Aerosol Powder Breath Activated, 108  (90 Base) MCG/ACT, 2 puffs as needed, Inhalation, every 6 hrs  Continue Advair HFA Aerosol, 230-21 MCG/ACT, 2 puffs, Inhalation,  Twice a day  Continue Singulair Tablet, 10 mg, 1 tablet, Orally, Once a day  Continue Flonase Suspension, 50 MCG/ACT, 1 spray in each nostril,  Nasally, Once a  day  Start Azithromycin Tablet, 500 mg, 1 tab, Orally, Every MWF, 8  weeks, 24, Refills 0  LAB: Immunoglobulin E IgE (IGE)  .  LAB: CBC/DIFF with PLT (CBCWD)  WBC     5.7     (4.0 - 11.0 - K/uL)  RBC     4.79     (3.70 - 5.00 - M/uL)  HGB     14.2     (11.0 - 15.0 - g/dL)  HCT     32.9     (51.8 - 45.0 - %)  MCV     85.6     (  80.0 - 98.0 - fL)  MCH     29.6     (26.0 - 34.0 - pg)  MCHC     34.6     (32.0 - 36.0 - g/dL)  RDW     81.1     (91.4 - 14.5 - %)  PLT     229     (150 - 400 - K/uL)  MPV     10.1     (9.1 - 11.7 - fL)  SEG NEUT     44     ( - %)  LYMPH     45     ( - %)  MONO     9     ( - %)  EOS     1     ( - %)  BASO     1     ( - %)  NEUT #     2.5     (1.5 - 7.5 - K/uL)  LYMPH #     2.6     (1.0 - 4.0 - K/uL)  MONO #     0.5     (0.2 - 0.8 - K/uL)  EOSIN #     0.1     (0.0 - 0.5 - K/uL)  BASO #     0.0     (0.0 - 0.2 - K/uL)  Imm Grnas     0     ( - %)  Imm Grans, Abs     0.0     (0.0 - 0.1 - K/uL)  .  [name]  CT Chest (Ordered for 02/01/2017)[id number]  .  FOLLOW UP  .  6 Weeks  .  Electronically signed by Sydnee Levans , MD on  02/08/2017 at 04:40 PM EDT  .  CONFIRMATORY SIGN OFF  TAKLA,MOUSSA , MD 02/02/2017 2:16:20 PM > , I personally interviewed and examined the patient and both the fellow and I contributed to this electronic note. I agree with the history, exam, assessment and plan as detailed in this note and edited it as necessary.  .  Document electronically signed by Melvenia Beam, AMY R   .

## 2017-02-01 NOTE — Progress Notes (Signed)
* * *        Kathy Morales**    --- ---    17 Y old Female, DOB: 1956/03/19, External MRN: [id number]    Account Number: [id number]    [address] [name], [id number]    Home: [phone]    Insurance: HMO BLUE OUT [employer]    PCP: Rella Larve Referring: Rella Larve    Appointment Facility: Pulmonology        * * *    02/01/2017  Progress Notes: Couper Juncaj Minus Breeding, MD **CHN#:** [id number]    --- ---    ---        Reason for Appointment    ---      1\. Asthma    ---      History of Present Illness    ---     _GENERAL_ :    Kathy Morales is a pleasant 49 F presenting for follow up regarding asthma and  chest CT results. At las visit she presented with poorly controlled symptoms.  She was started on Advair 230-21 2 puffs bid, singulair, flonase, and  albuterol prn. Since then her breathing and hoarsness have improved and she is  using her rescue inhaler much less frequently. She is still bothered by her  cough which remains non-productive. No triggers or pattern to cough. At times  feels like it comes from her throat, at time from her chest. She has been on  PPI bid and recently added H2 blocker at bedtime. Cough is improved since last  visit but remains problematic for her. Her other concern today is the finding  of a 3mm solid RML nodule on her chest CT. Given her hx of breast cancer (s/p  mastectomy 2004) she is concerned about the possibility of metastatic disease  or new primary lung cancer. She denies recent fevers, chills, night sweats,  sore throat, weight loss, chest pain, flushing, rash, joint aches.    PFTs: FEV1 2.09 (LLN 1.8) FVC 3.02 (LLN 2.5) F/F 69 No BD response TLC 5.39  (LLN 5.8) DLCO 20.0 (92% pred)    CT Chest: mild airway thickening, 3 mm solid nodule in RML. No consoloditave  or emphasematous changes.      Current Medications    ---    Taking     * Advair HFA 230-21 MCG/ACT Aerosol 2 puffs Inhalation Twice a day    ---    * Albuterol Sulfate 108 (90 Base) MCG/ACT Aerosol Powder Breath  Activated 2 puffs as needed Inhalation every 6 hrs    ---    * Ativan 1 MG Tablet 1 tablet at bedtime as needed Orally Once a day    ---    * Calcium 1 tab Oral daily    ---    * Fish Oil (omega-3 fatty acids)     ---    * Flonase 50 MCG/ACT Suspension 1 spray in each nostril Nasally Once a day    ---    * HCTZ (hydrochlorothiazide) 50 mg twice daily    ---    * Metformin 500 mg 1 tab Oral twice daily    ---    * Pantoprazole Sodium 40 MG Tablet Delayed Release 1 tablet Orally twice daily    ---    * Provigil 100 mg Tablet 3 tablet in the morning Orally Once a day    ---    * Simvastatin 80 MG Tablet 1 tablet in the evening Orally Once a day    ---    *  Singulair 10 mg Tablet 1 tablet Orally Once a day    ---    * Vitamin D once a day    ---    Not-Taking/PRN    * flovent 100MG  3 tabs twice daily    ---    * Medication List reviewed and reconciled with the patient    ---      Past Medical History    ---       High cholesterol.        ---    Hypertention.        ---    NIDDM.        ---    GERD.        ---    Asthma.        ---    Breast cancer.        ---      Surgical History    ---      mastectomy for breast CA 2004    ---    L4-L5 TLIF [facility] Dr. Charlton Amor 05/06/16    ---      Social History    ---    Tobacco  history: Never smoked.    Work/Occupation: employed full-time, Engineer, site. [name]    Alcohol  Yes but rarely.   Lives at home independently with her husband. No  longer using a cane to ambulate. Never smoker. rare etoh use. No illicits. No  exposure to mold. No pets (formerly had a cat). No hcange in housing recnetly,  most recent travel to [place] in 07/2016.    ---      Allergies    ---      aspirin    ---    NSAIDs    ---      Review of Systems    ---    See HPI.      Vital Signs    ---    Pain scale 0, Ht-in 63.6, Wt-lbs 178.3, BMI 30.99, BP 120/90, HR 110, RR 16,  Temp 99.0, Oxygen sat % 97%RA.      Examination    ---     _GENERAL_ :    General Well-appearing, no distress .    Eyes Pupils equal,  Round, Full range of motion, No sclera icteris, non-  injected .    Ears, Nose, Mouth and Throat posterrior nasopharyngeal erythema, inferior  turbinate hypertrophy and erythema.    Respiratory Good air movement, clear to ausculation, no wheeze or rhonci, no  accessory muscle use .    Cardiovascular RRR, nl S1 nd S2, no murmur, , No peripheral edema .    Abdomen Soft, Nontender, nondistended.    Lymph nodes No cervical lymphadenopathy .    Musculoskeletal / Neurological Normal gait, no atrophy of the lower or upper  extremities, full range of motion of lower and upper extremities,.    Skin No telangectasias, tatoos, or rashes, no nodules, no rashes or nail  changes .          Assessments    ---    1\. Asthma - J45.909 (Primary)    ---    2\. Pulmonary nodule less than 6 cm determined by computed tomography of lung  - R91.1    ---    3\. Cough - R05    ---      Kathy Morales is a pleasant 66 F presenting for foolow up for asthma and CT  chest. PFTs confirmed mild obstuctive  ventilatory defect, otherwise normal.  Her symptoms are much improved on ICS/LABA and montelukast. She is using her  prn SABA less frequently. Her cough remains bothersome however we discussed  that she likely needs mor time on current therapies (inhalers, flonase, PPI)  before we would persue further diagnositc testing such as laryngoscopy or  bronchoscopy.We did discuss possible benefit from addition of azithromycin MWF  for antiinflammatory effects and was agreeable to this. CBC checked today, no  evidence of eosinophilia. Regarding the 3 mm nodule on chest CT, Kathy Morales was  counseled regarding low risk features and absence of evidence to support  repeat imaging. however, given her history of breast cancer and significant  anxiety about possibility of missing another malignancy, we will obtain repeat  CT scan in 6 months.    - Continue Advair 230-21 2 puffs bid    - continue singulair daily    - continue albuterol prn    - continue flonase     - continue PPI, H2 blocker    - start Azithromycin 500 mg MWF x8 weeks    - repeat CT chest in 6 months    - f/u 6-8 weeks.    ---      Treatment    ---       **1\. Asthma**    Continue Albuterol Sulfate Aerosol Powder Breath Activated, 108 (90 Base)  MCG/ACT, 2 puffs as needed, Inhalation, every 6 hrs    Continue Advair HFA Aerosol, 230-21 MCG/ACT, 2 puffs, Inhalation, Twice a day    Continue Singulair Tablet, 10 mg, 1 tablet, Orally, Once a day    Continue Flonase Suspension, 50 MCG/ACT, 1 spray in each nostril, Nasally,  Once a day    Start Azithromycin Tablet, 500 mg, 1 tab, Orally, Every MWF, 8 weeks, 24,  Refills 0    _LAB: Immunoglobulin E IgE (IGE)_    _LAB: CBC/DIFF with PLT (CBCWD)_   WBC  5.7    4.0 - 11.0 - K/uL    --- --- --- ---    RBC  4.79    3.70 - 5.00 - M/uL    --- --- --- ---    HGB  14.2    11.0 - 15.0 - g/dL    --- --- --- ---    HCT  41.0    32.0 - 45.0 - %    --- --- --- ---    MCV  85.6    80.0 - 98.0 - fL    --- --- --- ---    MCH  29.6    26.0 - 34.0 - pg    --- --- --- ---    MCHC  34.6    32.0 - 36.0 - g/dL    --- --- --- ---    RDW  12.6    11.5 - 14.5 - %    --- --- --- ---    PLT  229    150 - 400 - K/uL    --- --- --- ---    MPV  10.1    9.1 - 11.7 - fL    --- --- --- ---    SEG NEUT  44     \- %    --- --- --- ---    LYMPH  45     \- %    --- --- --- ---    MONO  9     \- %    --- --- --- ---  EOS  1     \- %    --- --- --- ---    BASO  1     \- %    --- --- --- ---    NEUT #  2.5    1.5 - 7.5 - K/uL    --- --- --- ---    LYMPH #  2.6    1.0 - 4.0 - K/uL    --- --- --- ---    MONO #  0.5    0.2 - 0.8 - K/uL    --- --- --- ---    EOSIN #  0.1    0.0 - 0.5 - K/uL    --- --- --- ---    BASO #  0.0    0.0 - 0.2 - K/uL    --- --- --- ---    Imm Grnas  0     \- %    --- --- --- ---    Imm Grans, Abs  0.0    0.0 - 0.1 - K/uL    --- --- --- ---        Ardine Bjork: CT Chest (Ordered for 02/01/2017)_       Follow Up    ---    6 Weeks    Electronically signed by Sydnee Levans , MD on 02/08/2017  at 04:40 PM EDT    Sign off status: Completed        * * *        Pulmonology    [address]    [place], [place] [address]    Tel: [phone]    Fax: [phone]              * * *          Patient: Kathy Morales, Kathy Morales DOB: [birth year] Progress Note: Ellington Greenslade Minus Breeding, MD  02/01/2017    ---    Note generated by eClinicalWorks EMR/PM Software ([email or web address])

## 2017-02-08 LAB — HX ASTHMA - PFTS

## 2017-04-12 ENCOUNTER — Ambulatory Visit: Admitting: Internal Medicine

## 2017-04-12 ENCOUNTER — Ambulatory Visit

## 2017-04-12 MED ORDER — Advair HFA: 3 | Freq: Two times a day (BID) | 3 refills | 0 days | Status: AC

## 2017-04-12 NOTE — Progress Notes (Signed)
* * *        **  Kathy Morales**    --- ---    38 Y old Female, DOB: [birth year]    [address] [place], [place] [address]    Home: [phone]    Provider: Otto Herb, MD        * * *    Telephone Encounter    ---    Answered by   Otto Herb  Date: 04/12/2017         Time: 04:19 PM    Refills  Refill Advair HFA Aerosol, 230-21 MCG/ACT, Inhalation, 3, 2 puffs,  Twice a day, 90 days, Refills=3    --- ---          * * *                ---          * * *          Patient: Kathy Morales, Kathy Morales DOB: 10/22/1955 Provider: Otto Herb, MD  04/12/2017    ---    Note generated by eClinicalWorks EMR/PM Software ([email or web address])

## 2017-04-26 ENCOUNTER — Ambulatory Visit

## 2017-04-26 MED ORDER — Advair HFA: 3 | Freq: Two times a day (BID) | 0 refills | 0 days | Status: AC

## 2017-04-26 MED ORDER — Singulair: 10 | 90 | Freq: Every day | 0 refills | 0 days | Status: AC

## 2017-04-26 NOTE — Progress Notes (Signed)
* * *        **  Kathy Morales**    --- ---    84 Y old Female, DOB: 18-Jun-1955    [address] [place], [place] [address]    Home: [phone]    Provider: Alfredo Batty        * * *    Telephone Encounter    ---    Answered by   Vilma Meckel  Date: 04/26/2017         Time: 11:34 AM    Action Taken   Vilma Meckel , MD 04/26/2017 11:34:45 AM > Advair and  Montelukast prescriptions sent to her pharmacy    --- ---            Refills  Refill Advair HFA Aerosol, 230-21 MCG/ACT, Inhalation, 3, 2 puffs,  Twice a day, 90 days, Refills=0    --- ---      Refill Singulair Tablet, 10 mg, Orally, 90, 1 tablet, Once a day, 90 days,  Refills=0          * * *                ---          * * *          Patient: Kathy Morales, Kathy Morales DOB: April 07, 1956 Provider: Sydnee Levans R 04/26/2017    ---    Note generated by eClinicalWorks EMR/PM Software ([email or web address])

## 2017-05-03 ENCOUNTER — Ambulatory Visit: Admit: 2017-05-03 | Payer: HMO

## 2017-05-03 ENCOUNTER — Ambulatory Visit

## 2017-05-03 MED ORDER — Singulair: 10 | 90 | Freq: Every day | 4 refills | 0 days | Status: AC

## 2017-05-03 MED ORDER — Advair HFA: 3 | Freq: Two times a day (BID) | 4 refills | 0 days | Status: AC

## 2017-05-03 NOTE — Progress Notes (Signed)
* * *        Kathy Morales**    --- ---    61 Y old Female, DOB: 05-Aug-1955, External MRN: [id number]    Account Number: [id number]    [address] [name], [place]    Home: [phone]    Insurance: HMO BLUE OUT [employer]    PCP: Rella Larve Referring: Rella Larve    Appointment Facility: Pulmonology        * * *    05/03/2017   **Appointment Provider:** Shanna Cisco, MD **CHN#:** [id number]    --- ---      **Supervising Provider:** AMY Minus Breeding, MD    ---        Reason for Appointment    ---      1\. Follow-up: asthma    ---      History of Present Illness    ---     _GENERAL_ :    61 year old female with severe, persistent asthma on high dose ICS and LABA  therapy last seen in clinic in 01/2017 by Dr. Candiss Norse.    Since her last clinic visit, she feels better, although, she continues to have  a cough, which is mildly bothersome to her. She notes that her symptoms are  worse at work and at nighttime. She denies exposure to dust, perfumes, mold  exposure at work. She did use azithromycin thrice weekly for a month, and  noted some mild improvement. She has not required prednisone or antibiotics,  and did get the influenza vaccine this year. She uses albuterol PRN at  nighttime twice a week, and is otherwise compliant with her ICS/LABA inhaler.  She denies oral thrush or painful swallowing. She has mild-moderate OSA and  has an appointment to start CPAP therapy. Her last meal before bedtime is  late, and she goes to sleep straight after. She also complains of frequent  throat clearing, and uses Flonase as needed. She has lost weight since her  last visit.    PFTs: FEV1 2.09 (LLN 1.8) FVC 3.02 (LLN 2.5) F/F 69 No BD response TLC 5.39  (LLN 3.8) DLCO 20.0 (92% pred).      Current Medications    ---    Taking     * Advair HFA 230-21 MCG/ACT Aerosol 2 puffs Inhalation Twice a day    ---    * Albuterol Sulfate 108 (90 Base) MCG/ACT Aerosol Powder Breath Activated 2 puffs as needed Inhalation every 6 hrs    ---    *  Ativan 1 MG Tablet 1 tablet at bedtime as needed Orally Once a day, Notes: takes nightly    ---    * Calcium 1 tab Oral daily    ---    * Fish Oil (omega-3 fatty acids)     ---    * Flonase 50 MCG/ACT Suspension 1 spray in each nostril Nasally Once a day    ---    * HCTZ (hydrochlorothiazide) 50 mg twice daily    ---    * Metformin 500 mg 1 tab Oral twice daily    ---    * Pantoprazole Sodium 40 MG Tablet Delayed Release 1 tablet Orally twice daily    ---    * RANITIDINE 150 MG TABLET     ---    * Simvastatin 80 MG Tablet 1 tablet in the evening Orally Once a day    ---    * Singulair 10 mg Tablet 1 tablet Orally Once  a day    ---    * Vitamin D once a day    ---    Not-Taking/PRN    * Azithromycin 500 mg Tablet 1 tab Orally Every MWF    ---    * flovent 100MG  3 tabs twice daily    ---    * Provigil 100 mg Tablet 3 tablet in the morning Orally Once a day    ---    * Medication List reviewed and reconciled with the patient    ---      Past Medical History    ---       Hypercholesterolemia.        ---    Hypertention.        ---    NIDDM.        ---    GERD.        ---    Asthma.        ---    Breast cancer.        ---      Surgical History    ---      Mastectomy for breast CA 2004    ---    L4-L5 TLIF [facility] Dr. Charlton Amor 05/06/16    ---      Family History    ---      Mother: alive, diagnosed with Diabetes, Hypertension, Heart Disease    ---    Father: alive, diagnosed with Diabetes, Hypertension, Heart Disease    ---    Per pt no FH of asthma (save for husband).    ---      Social History    ---    Tobacco    history: _Never smoked_    Work/Occupation: employed full-time, Engineer, site. [name]    Alcohol    _Yes but rarely_   Lives at home independently with her husband. No longer  using a cane to ambulate. Never smoker. Rare etoh use. No illicits. No  exposure to mold. No pets (formerly had a cat). No change in housing recently,  most recent travel to [place] in 07/2016.    ---      Allergies    ---      aspirin     ---    NSAIDs    ---      Review of Systems    ---     _ADULT Pulmonary_ :    Constitutional no fever, chills, night sweats. Eyes no vision changes. HENT no  nasal congestion, runny nose. Cardiovascular no chest pain, palpitations.  Genitourinary no difficulty urinating. Musculoskeletal no joint pain.  Neurological no dizziness. Psychological no depression, anxiety. Skin no rash.          Vital Signs    ---    Pain scale 0, Ht-in 63.6, BP 122/88, HR 96, RR 12, Temp 97.7, Oxygen sat %  99RA.      Examination    ---     _GENERAL_ :    General Well-appearing, no distress .    Ears, Nose, Mouth and Throat Posterior nasopharyngeal erythema, inferior  turbinate hypertrophy and erythema .    Respiratory Good air movement, clear to ausculation, no wheeze or rhonci, no  accessory muscle use .    Cardiovascular RRR, nl S1 nd S2, no murmur, no peripheral edema .    Lymph nodes No cervical lymphadenopathy .    Skin No telangectasias, tattoos, or rashes, no nodules or nail changes .  Assessments    ---    1\. Asthma - J45.909 (Primary)    ---    2\. Lung nodule < 6cm on CT - R91.1    ---      [name] is a 61 year old female with history of severe, persistent asthma,  GERD and upper airway cough syndrome. Since her last clinic visit, her cough  is much better, although not completely resolved. I believe that her cough is  likely multifactorial secondary to GERD and UACS, and not entirely explained  by asthma (to the extent that it is not clear that she has severe disease).    Plan:    -We discussed diet and lifestyle modifications to help control GERD, including changing the time of her last meal to 2 hours before bedtime, eating small, frequent meals and avoidance of red wine, red meat, caffeinated beverages.    -Refer to Dr. Revonda Standard (GI) for evaluation of GERD. Does she need surgical treatment with a Nissen fundoplication?    -I advised the use of nasal saline along with Flonase to prevent drying of the nares and  epistaxis. Nasal saline should be used up to five times a day.    -Try a non-sedating histamine without a decongestant - cetirzine, Allegra at bedtime.    -We provided teaching on how to use a peak flow meter. Her peak flow in clinic was . We have asked that she measure a peak flow at work and at home and while on vacation, and record results to bring to her next clinic visit.    -Continue Advair and singulair. The need of high dose Advair should be re-assessed at each visit.    -Ongoing weight loss encouraged - this will also help to improve respiratory symptoms.    -We advised against the use of lorazepam since she has untreated OSA.    -We will refer her to PCP for management of hyperlipidemia - simvastatin 80mg  has a FDA warning against its use. This should be changed.    -We advised that nodules < 6mm in a non-smoker do not require follow-up. Metastatic disease with one small nodule is extremely unlikely. We advise against follow-up imaging.    -We will see her again in 8 months or sooner if clinically indicated.    ---      Treatment    ---       **1\. Asthma**    Start Singulair Tablet, 10 MG, 1 tablet, Orally, Once a day, 90 day(s), 90,  Refills 4    Start Advair HFA Aerosol, 230-21 MCG/ACT, 2 puffs, Inhalation, Twice a day, 90  days, 3, Refills 4    _IMAGING: CT Chest_    _PROCEDURE: PEAK FLOW METER_    Notes: Asthma in Adults: Care Instructions material was printed.    ---      Follow Up    ---    August    **Appointment Provider:** Shanna Cisco, MD    Electronically signed by Sydnee Levans , MD on 05/10/2017 at 01:53 PM EST    Sign off status: Completed        * * *        Pulmonology    [address]    [place], [place] [address]    Tel: [phone]    Fax: [phone]              * * *          Patient: KALLEY, NICHOLL DOB: June 14, 1955 Progress Note:  Shanna Cisco, MD  05/03/2017    ---    Note generated by eClinicalWorks EMR/PM Software ([email or web address])

## 2017-05-03 NOTE — Progress Notes (Signed)
.  Progress Notes  .  Patient: Kathy Morales  Provider: Shanna Cisco  MD  .  DOB: [birth year] Age: 61 Y Sex: Female  Supervising Provider:: AMY Minus Breeding, MD  Date: 05/03/2017  .  PCP: Kathy Morales   Date: 05/03/2017  .  --------------------------------------------------------------------------------  .  REASON FOR APPOINTMENT  .  1. Follow-up: asthma  .  HISTORY OF PRESENT ILLNESS  .  GENERAL:   61 year old female with severe, persistent asthma on high dose  ICS and LABA therapy last seen in clinic in 01/2017 by Dr. Candiss Norse.  Since her last clinic visit, she feels better, although, she  continues to have a cough, which is mildly bothersome to her. She  notes that her symptoms are worse at work and at nighttime. She  denies exposure to dust, perfumes, mold exposure at work. She did  use azithromycin thrice weekly for a month, and noted some mild  improvement. She has not required prednisone or antibiotics, and  did get the influenza vaccine this year. She uses albuterol PRN  at nighttime twice a week, and is otherwise compliant with her  ICS/LABA inhaler. She denies oral thrush or painful swallowing.  She has mild-moderate OSA and has an appointment to start CPAP  therapy. Her last meal before bedtime is late, and she goes to  sleep straight after. She also complains of frequent throat  clearing, and uses Flonase as needed. She has lost weight since  her last visit.PFTs: FEV1 2.09 (LLN 1.8) FVC 3.02 (LLN 2.5) F/F  69 No BD response TLC 5.39 (LLN 3.8) DLCO 20.0 (92% pred).  .  CURRENT MEDICATIONS  .  Taking Advair HFA 230-21 MCG/ACT Aerosol 2 puffs Inhalation Twice  a day  Taking Albuterol Sulfate 108 (90 Base) MCG/ACT Aerosol Powder  Breath Activated 2 puffs as needed Inhalation every 6 hrs  Taking Ativan 1 MG Tablet 1 tablet at bedtime as needed Orally  Once a day, Notes: takes nightly  Taking Calcium 1 tab Oral daily  Taking Fish Oil (omega-3 fatty acids)  Taking Flonase 50 MCG/ACT Suspension 1 spray in each  nostril  Nasally Once a day  Taking HCTZ (hydrochlorothiazide) 50 mg twice daily  Taking Metformin 500 mg 1 tab Oral twice daily  Taking Pantoprazole Sodium 40 MG Tablet Delayed Release 1 tablet  Orally twice daily  Taking RANITIDINE 150 MG TABLET  Taking Simvastatin 80 MG Tablet 1 tablet in the evening Orally  Once a day  Taking Singulair 10 mg Tablet 1 tablet Orally Once a day  Taking Vitamin D once a day  Not-Taking/PRN Azithromycin 500 mg Tablet 1 tab Orally Every MWF  Not-Taking/PRN flovent 100MG  3 tabs twice daily  Not-Taking/PRN Provigil 100 mg Tablet 3 tablet in the morning  Orally Once a day  Medication List reviewed and reconciled with the patient  .  PAST MEDICAL HISTORY  .  Hypercholesterolemia  Hypertention  NIDDM  GERD  Asthma  Breast cancer  .  ALLERGIES  .  aspirin  NSAIDs  .  SURGICAL HISTORY  .  Mastectomy for breast CA 2004  L4-L5 TLIF [facility] Dr. Charlton Amor 05/06/16  .  FAMILY HISTORY  .  Mother: alive, diagnosed with Diabetes, Hypertension, Heart  Disease  Father: alive, diagnosed with Diabetes, Hypertension, Heart  Disease  Per pt no FH of asthma (save for husband).  .  SOCIAL HISTORY  .  .  Tobacco  history:Never smoked  .  [name]  Work/Occupation: employed full-time, Teacher, music  lawyer.  .  .  .  Alcohol  Yes but rarely  .  Lives at home independently with her husband. No longer using a  cane to ambulate. Never smoker. Rare etoh use. No illicits. No  exposure to mold. No pets (formerly had a cat). No change in  housing recently, most recent travel to [place] in 07/2016.  [name]  REVIEW OF SYSTEMS  .  ADULT Pulmonary:  .  Constitutional    no fever, chills, night sweats . Eyes    no  vision changes . HENT    no nasal congestion, runny nose .  Cardiovascular    no chest pain, palpitations . Genitourinary     no difficulty urinating . Musculoskeletal    no joint pain .  Neurological    no dizziness . Psychological    no depression,  anxiety . Skin    no rash .  [name]  VITAL SIGNS  .  Pain scale 0, Ht-in 63.6,  BP 122/88, HR 96, RR 12, Temp 97.7,  Oxygen sat % 99RA.  [name]  EXAMINATION  .  GENERAL:  GeneralWell-appearing, no distress .  [name]  Ears, Nose, Mouth and ThroatPosterior nasopharyngeal erythema,  inferior turbinate hypertrophy and erythema .  [name]  RespiratoryGood air movement, clear to ausculation, no wheeze or  rhonci, no accessory muscle use .  [name]  CardiovascularRRR, nl S1 nd S2, no murmur, no peripheral edema .  [name]  Lymph nodesNo cervical lymphadenopathy .  Lilyan Gilford telangectasias, tattoos, or rashes, no nodules or nail  changes .  [name]  ASSESSMENTS  .  Asthma - J45.909 (Primary)  .  Lung nodule < 6cm on CT - R91.1  .  [name] is a 61 year old female with history of severe,  persistent asthma, GERD and upper airway cough syndrome. Since  her last clinic visit, her cough is much better, although not  completely resolved. I believe that her cough is likely  multifactorial secondary to GERD and UACS, and not entirely  explained by asthma (to the extent that it is not clear that she  has severe disease). Plan: -We discussed diet and lifestyle  modifications to help control GERD, including changing the time  of her last meal to 2 hours before bedtime, eating small,  frequent meals and avoidance of red wine, red meat, caffeinated  beverages.-Refer to Dr. Revonda Standard (GI) for evaluation of GERD. Does  she need surgical treatment with a Nissen fundoplication?-I  advised the use of nasal saline along with Flonase to prevent  drying of the nares and epistaxis. Nasal saline should be used up  to five times a day.-Try a non-sedating histamine without a  decongestant - cetirzine, Allegra at bedtime.-We provided  teaching on how to use a peak flow meter. Her peak flow in clinic  was . We have asked that she measure a peak flow at work and  at home and while on vacation, and record results to bring to her  next clinic visit.-Continue Advair and singulair. The need of  high dose Advair should be re-assessed at each  visit.-Ongoing  weight loss encouraged - this will also help to improve  respiratory symptoms.-We advised against the use of lorazepam  since she has untreated OSA.-We will refer her to PCP for  management of hyperlipidemia - simvastatin 80mg  has a FDA warning  against its use. This should be changed.-We advised that nodules  < 6mm in a non-smoker do not require follow-up. Metastatic  disease with  one small nodule is extremely unlikely. We advise  against follow-up imaging.-We will see her again in 8 months or  sooner if clinically indicated.  .  TREATMENT  .  Asthma  Start Singulair Tablet, 10 MG, 1 tablet, Orally, Once a day, 90  day(s), 90, Refills 4  Start Advair HFA Aerosol, 230-21 MCG/ACT, 2 puffs, Inhalation,  Twice a day, 90 days, 3, Refills 4  CT [id number]  PEAK FLOW METER  Notes: Asthma in Adults: Care Instructions material was printed.  .  FOLLOW UP  .  August  .  .  Appointment Provider: Shanna Cisco, MD  .  Electronically signed by Sydnee Levans , MD on  05/10/2017 at 01:53 PM EST  .  CONFIRMATORY SIGN OFF  I personally interviewed and examined the patient and both the fellow and I contributed to this electronic note. I agree with the history, exam, assessment and plan as detailed in this note and edited it as necessary.  .  Document electronically signed by Shanna Cisco  MD  .

## 2017-05-10 LAB — HX ASTHMA - PFTS

## 2018-01-17 ENCOUNTER — Ambulatory Visit: Admit: 2018-01-17 | Payer: HMO

## 2018-01-17 ENCOUNTER — Ambulatory Visit

## 2018-01-17 NOTE — Progress Notes (Signed)
* * *        **Kathy Morales**    --- ---    63 Y old Female, DOB: 1956/01/26, External MRN: [id number]    Account Number: [id number]    [address] [name], [place]    Home: [phone]    Insurance: HMO BLUE OUT [employer]    PCP: Rella Larve Referring: Rella Larve    Appointment Facility: Pulmonology        * * *    01/17/2018  Progress Notes: Kathy Warr Minus Breeding, MD **CHN#:** [phone]    --- ---    ---         **Reason for Appointment**    ---       1\. CT Scan follow up and cough    ---       **History of Present Illness**    ---     _GENERAL_ :    Kathy Morales is a 85 yoF with history of severe, persistent asthma, GERD and  upper airway cough syndrome here for follow up of CT scan. Patient states that  her former dry constant cough has actually completely resolved. However, she  has developed a different type of rattling cough over the last several months  which occurs 3-4x daily and is productive of small amount of mucus. Has not  noticed any triggers which seem to bring on the cough, but it generally does  not occur at nighttime. States that her breathing is otherwise doing very  well; almost never has to use albuterol inhaler. Using flonase, advair, and  singulair as directed. No fevers or chills. Intentional weight loss of 40lbs  over the last year. Acid reflux well controlled at present. She is overall  feeling well and wishes to follow up on repeat CT scan for pulmonary nodule.       **Current Medications**    ---    Taking     * Advair HFA 230-21 MCG/ACT Aerosol 2 puffs Inhalation Twice a day    ---    * Albuterol Sulfate 108 (90 Base) MCG/ACT Aerosol Powder Breath Activated 2 puffs as needed Inhalation every 6 hrs    ---    * Ativan 1 MG Tablet 1 tablet at bedtime as needed Orally Once a day, Notes: takes nightly    ---    * Calcium 1 tab Oral daily    ---    * Fish Oil (omega-3 fatty acids)     ---    * Flonase 50 MCG/ACT Suspension 1 spray in each nostril Nasally Once a day    ---    * HCTZ  (hydrochlorothiazide) 50 mg twice daily    ---    * Magnesium     ---    * Metformin 500 mg 1 tab Oral twice daily    ---    * Pantoprazole Sodium 40 MG Tablet Delayed Release 1 tablet Orally twice daily    ---    * potassium     ---    * RANITIDINE 150 MG TABLET     ---    * Simvastatin 80 MG Tablet 1 tablet in the evening Orally Once a day    ---    * Singulair 10 mg Tablet 1 tablet Orally Once a day    ---    * Vitamin D once a day    ---    Not-Taking/PRN    * Azithromycin 500 mg Tablet 1 tab Orally Every  MWF    ---    * flovent 100MG  3 tabs twice daily    ---    * Provigil 100 mg Tablet 3 tablet in the morning Orally Once a day    ---    Discontinued    * Advair HFA 230-21 MCG/ACT Aerosol 2 puffs Inhalation Twice a day    ---    * Singulair 10 MG Tablet 1 tablet Orally Once a day    ---    * Medication List reviewed and reconciled with the patient    ---       **Past Medical History**    ---       Hypercholesterolemia.        ---    Hypertention.        ---    NIDDM.        ---    GERD.        ---    Asthma.        ---    Breast cancer.        ---       **Surgical History**    ---       Mastectomy for breast CA 2004    ---    L4-L5 TLIF [facility] Dr. Charlton Amor 05/06/16    ---       **Family History**    ---       Mother: alive, diagnosed with Diabetes, Hypertension, Heart Disease    ---    Father: alive, Diabetes, Hypertension, Heart Disease    ---    Per pt no FH of asthma (save for husband).    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    Tobacco  history: Never smoked.   Lives at home independently with her  husband. No longer using a cane to ambulate. Never smoker. Rare etoh use. No  illicits. No exposure to mold. No pets (formerly had a cat). No change in  housing recently, most recent travel to [place] in 07/2016.    ---       **Allergies**    ---       aspirin    ---    NSAIDs    ---       **Hospitalization/Major Diagnostic  Procedure**    ---       No Hospitalization History.    ---       **Review of Systems**    ---     _ADULT Pulmonary_ :    Constitutional no fever, chills, night sweats. Eyes no vision changes. HENT no  nasal congestion, runny nose. Respiratory as per HPI. Cardiovascular no chest  pain, palpitations. Gastrointestinal chronic reflux. Genitourinary no  difficulty urinating. Musculoskeletal _no joint pain_. Neurological no  dizziness. Psychological no depression, anxiety. Endocrine intentional weight  loss. Skin no rash.          **Vital Signs**    ---    Pain scale 0, Ht-in 63.6, Wt-lbs 139.8, BMI 24.30, BP 147/75, HR 95, RR 16,  Temp 98.3, Oxygen sat % 97RA.       **Examination**    ---     _Pulmonary_ :    General Appearance: no apparent distress, alert and oriented x 3.    Oropharynx: mucus membrane moist. Oropharynx without cobblestoning.    Nasal Mucosa: moist, pink, without exudate.  Neck: No stridor. No mass palpated.    Chest Inspection: Normal .    Chest Percussion: No dullness, no hyperresonance.    Breath sounds: without weezes, rhonchi or rales, clear to auscultation  bilaterally.    Respiratory Effort: No use of accessory muscles.    Heart Exam: Regular S1, 2.    Gastrointestinal: nontender, bowel sounds present.    Extremities: No cyanosis, no clubbing, no edema.    Lymphatic: No cervical adenopathy.    Peripheral Vascular System: no leg edema.    Skin:  no rashes or lesions.          **Assessments**    ---    1\. Cough - R05 (Primary)    ---    2\. Asthma - J45.909    ---    3\. Pulmonary nodule - R91.1    ---    4\. GERD (gastroesophageal reflux disease) - K21.9    ---      Kathy Morales is a 67 yoF with history of severe, persistent asthma, GERD and  upper airway cough syndrome here for follow up of CT scan. Patient states that  her former dry constant cough is now replaced with an infrequent cough  occasionally productive of mucus. Otherwise, well controlled from asthma  standpoint. Will continue  current regimen of flonase, advair, singulair,  albuterol. Although no apparent signs of infection on CT scan, will still give  patient specimen cups for sputum collection as this is a change from her  baseline symptoms to rule out colonization of bacteria or a more chronic  infection. Regarding CT scan for nodule, will follow up formal radiology read  but does not appear to show any changes from prior. We discussed with patient  that given the very small size of the nodule on prior imaging and her lack of  smoking history, that a repeat CT scan is not indicated.    ---       **Treatment**    ---       **1\. Cough**    _LAB: AFB Culture_    _LAB: Sputum Culture+Gram Stain_    _LAB: Fungus Culture_    _LAB: AFB Stain_    Notes: Please give patient cups x3 for sputum samples.    ---        **2\. Asthma**    Refill Advair HFA Aerosol, 230-21 MCG/ACT, 2 puffs, Inhalation, Twice a day,  90 days, 3, Refills 3    Refill Albuterol Sulfate Aerosol Powder Breath Activated, 108 (90 Base)  MCG/ACT, 2 puffs as needed, Inhalation, every 6 hrs, 90 days, 3, Refills 3    Refill Flonase Suspension, 50 MCG/ACT, 1 spray in each nostril, Nasally, Once  a day, 90 days, 3, Refills 3    Refill Singulair Tablet, 10 mg, 1 tablet, Orally, Once a day, 90 days, 90,  Refills 3         **3\. Pulmonary nodule**    Notes: No follow-up imaging needed.      **Follow Up**    ---    prn    Electronically signed by Sydnee Levans , MD on 01/24/2018 at 05:07 PM EDT    Sign off status: Completed        * * *        Pulmonology    [address]    [place], [address]    [place], [place] [address]    Tel: [phone]    Fax: [phone]              * * *  Patient: Kathy Morales, Kathy Morales DOB: 15-Mar-1956 Progress Note: Kathy Morales Minus Breeding, MD  01/17/2018    ---    Note generated by eClinicalWorks EMR/PM Software ([email or web address])

## 2018-01-17 NOTE — Progress Notes (Signed)
.  Progress Notes  .  Patient: Kathy Morales, Kathy Morales  Provider: Sydnee Levans R   .  DOB: 07/08/1955 Age: 62 Y Sex: Female  Supervising Provider:: AMY Minus Breeding, MD  Date: 01/17/2018  .  PCP: Rella Larve   Date: 01/17/2018  .  --------------------------------------------------------------------------------  .  REASON FOR APPOINTMENT  .  1. CT Scan follow up and cough  .  HISTORY OF PRESENT ILLNESS  .  GENERAL:   [name] is a 89 yoF with history of severe, persistent asthma,  GERD and upper airway cough syndrome here for follow up of CT  scan. Patient states that her former dry constant cough has  actually completely resolved. However, she has developed a  different type of rattling cough over the last several months  which occurs 3-4x daily and is productive of small amount of  mucus. Has not noticed any triggers which seem to bring on the  cough, but it generally does not occur at nighttime. States that  her breathing is otherwise doing very well; almost never has to  use albuterol inhaler. Using flonase, advair, and singulair as  directed. No fevers or chills. Intentional weight loss of 40lbs  over the last year. Acid reflux well controlled at present. She  is overall feeling well and wishes to follow up on repeat CT scan  for pulmonary nodule.  [name]  CURRENT MEDICATIONS  .  Taking Advair HFA 230-21 MCG/ACT Aerosol 2 puffs Inhalation Twice  a day  Taking Albuterol Sulfate 108 (90 Base) MCG/ACT Aerosol Powder  Breath Activated 2 puffs as needed Inhalation every 6 hrs  Taking Ativan 1 MG Tablet 1 tablet at bedtime as needed Orally  Once a day, Notes: takes nightly  Taking Calcium 1 tab Oral daily  Taking Fish Oil (omega-3 fatty acids)  Taking Flonase 50 MCG/ACT Suspension 1 spray in each nostril  Nasally Once a day  Taking HCTZ (hydrochlorothiazide) 50 mg twice daily  Taking Magnesium  Taking Metformin 500 mg 1 tab Oral twice daily  Taking Pantoprazole Sodium 40 MG Tablet Delayed Release 1 tablet  Orally twice  daily  Taking potassium  Taking RANITIDINE 150 MG TABLET  Taking Simvastatin 80 MG Tablet 1 tablet in the evening Orally  Once a day  Taking Singulair 10 mg Tablet 1 tablet Orally Once a day  Taking Vitamin D once a day  Not-Taking/PRN Azithromycin 500 mg Tablet 1 tab Orally Every MWF  Not-Taking/PRN flovent 100MG  3 tabs twice daily  Not-Taking/PRN Provigil 100 mg Tablet 3 tablet in the morning  Orally Once a day  Discontinued Advair HFA 230-21 MCG/ACT Aerosol 2 puffs Inhalation  Twice a day  Discontinued Singulair 10 MG Tablet 1 tablet Orally Once a day  Medication List reviewed and reconciled with the patient  .  PAST MEDICAL HISTORY  .  Hypercholesterolemia  Hypertention  NIDDM  GERD  Asthma  Breast cancer  .  ALLERGIES  .  aspirin  NSAIDs  .  SURGICAL HISTORY  .  Mastectomy for breast CA 2004  L4-L5 TLIF [facility] Dr. Charlton Amor 05/06/16  .  FAMILY HISTORY  .  Mother: alive, diagnosed with Diabetes, Hypertension, Heart  Disease  Father: alive, Diabetes, Hypertension, Heart Disease  Per pt no FH of asthma (save for husband).  .  SOCIAL HISTORY  .  [name]  Work/Occupation: employed full-time, Engineer, site..  .  Alcohol Yes but rarely.  .  Tobaccohistory:Never smoked.  .  Lives at home independently with  her husband. No longer using a  cane to ambulate. Never smoker. Rare etoh use. No illicits. No  exposure to mold. No pets (formerly had a cat). No change in  housing recently, most recent travel to [place] in 07/2016.  [name]  HOSPITALIZATION/MAJOR DIAGNOSTIC PROCEDURE  .  No Hospitalization History.  [name]  REVIEW OF SYSTEMS  .  ADULT Pulmonary:  .  Constitutional    no fever, chills, night sweats . Eyes    no  vision changes . HENT    no nasal congestion, runny nose .  Respiratory    as per HPI . Cardiovascular    no chest pain,  palpitations . Gastrointestinal    chronic reflux . Genitourinary     no difficulty urinating . Musculoskeletal    no joint pain .  Neurological    no dizziness . Psychological    no  depression,  anxiety . Endocrine    intentional weight loss . Skin    no rash  .  [name]  VITAL SIGNS  .  Pain scale 0, Ht-in 63.6, Wt-lbs 139.8, BMI 24.30, BP 147/75, HR  95, RR 16, Temp 98.3, Oxygen sat % 97RA.  [name]  EXAMINATION  .  Pulmonary:  General Appearance:no apparent distress, alert and oriented x 3.  Oropharynx:mucus membrane moist. Oropharynx without  cobblestoning.  Nasal Mucosa:moist, pink, without exudate.  Neck:No stridor. No mass palpated.  Chest Inspection:Normal .  Chest Percussion:No dullness, no hyperresonance.  Breath sounds:without weezes, rhonchi or rales, clear to  auscultation bilaterally.  Respiratory Effort:No use of accessory muscles.  Heart Exam:Regular S1, 2.  Gastrointestinal:nontender, bowel sounds present.  Extremities:No cyanosis, no clubbing, no edema.  Lymphatic:No cervical adenopathy.  Peripheral Vascular System:no leg edema.  Skin: no rashes or lesions.  .  ASSESSMENTS  .  Cough - R05 (Primary)  .  Asthma - J45.909  .  Pulmonary nodule - R91.1  .  GERD (gastroesophageal reflux disease) - K21.9  .  [name] is a 59 yoF with history of severe, persistent asthma,  GERD and upper airway cough syndrome here for follow up of CT  scan. Patient states that her former dry constant cough is now  replaced with an infrequent cough occasionally productive of  mucus. Otherwise, well controlled from asthma standpoint. Will  continue current regimen of flonase, advair, singulair,  albuterol. Although no apparent signs of infection on CT scan,  will still give patient specimen cups for sputum collection as  this is a change from her baseline symptoms to rule out  colonization of bacteria or a more chronic infection. Regarding  CT scan for nodule, will follow up formal radiology read but does  not appear to show any changes from prior. We discussed with  patient that given the very small size of the nodule on prior  imaging and her lack of smoking history, that a repeat CT scan is  not  indicated.  .  TREATMENT  .  Cough  LAB: AFB Culture  .  LAB: Sputum Culture+Gram Stain  .  LAB: Fungus Culture  .  LAB: AFB Stain  .  Notes: Please give patient cups x3 for sputum samples.  .  .  Asthma  Refill Advair HFA Aerosol, 230-21 MCG/ACT, 2 puffs, Inhalation,  Twice a day, 90 days, 3, Refills 3  Refill Albuterol Sulfate Aerosol Powder Breath Activated, 108 (90  Base) MCG/ACT, 2 puffs as needed, Inhalation, every 6 hrs, 90  days, 3, Refills 3  Refill Flonase  Suspension, 50 MCG/ACT, 1 spray in each nostril,  Nasally, Once a day, 90 days, 3, Refills 3  Refill Singulair Tablet, 10 mg, 1 tablet, Orally, Once a day, 90  days, 90, Refills 3  .  .  Pulmonary nodule  Notes: No follow-up imaging needed.  .  FOLLOW UP  .  prn  .  Electronically signed by Sydnee Levans , MD on  01/24/2018 at 05:07 PM EDT  .  CONFIRMATORY SIGN OFF  I personally interviewed and examined the patient and both the fellow and I contributed to this electronic note. I agree with the history, exam, assessment and plan as detailed in this note and edited it as necessary.   .  Document electronically signed by Melvenia Beam, AMY R   .

## 2018-01-17 NOTE — Progress Notes (Signed)
* * *        **  [name]**    --- ---    81 Y old Female, DOB: [birth year]    [address] [place], [place] [address]    Home: [phone]    Provider: Alfredo Batty        * * *    Telephone Encounter    ---    Answered by   Ewing Schlein  Date: 01/17/2018         Time: 05:25 PM    Message                      Need to call patient with her CT scan results once radiology reads report        --- ---            Action Taken                      [facility]  01/18/2018 3:19:59 PM > Reports shows stable findings, no new nodules, no need for repeat CT. Called pt x2 but did not answer phone.      STUEWE,ELENA  01/19/2018 11:56:10 AM > Called again, pt did not answer, left voicemail                    * * *                ---          * * *          Patient: RAIGAN, BARIA DOB: 03/07/1956 Provider: Sydnee Levans R 01/17/2018    ---    Note generated by eClinicalWorks EMR/PM Software ([email or web address])

## 2018-01-18 MED ORDER — Albuterol Sulfate: 108 | 3 | 3 refills | 0 days | Status: AC

## 2018-01-18 MED ORDER — Flonase: 50 | 3 | Freq: Every day | 3 refills | 0 days | Status: AC

## 2018-01-18 MED ORDER — Singulair: 10 | 90 | Freq: Every day | 3 refills | 0 days | Status: AC

## 2018-01-18 MED ORDER — Advair HFA: 3 | Freq: Two times a day (BID) | 3 refills | 0 days | Status: AC

## 2018-01-24 LAB — HX ASTHMA - PFTS

## 2018-06-23 ENCOUNTER — Ambulatory Visit: Admitting: Surgery

## 2018-06-23 LAB — HX RTN. URINE WITH REFLEX
HX ASCORBIC ACID URINE: NEGATIVE
HX URINE BILIRUBIN: NEGATIVE
HX URINE BLOOD: NEGATIVE
HX URINE ESTERASE: 500 leu/uL — AB
HX URINE GLUCOSE: NEGATIVE
HX URINE KETONES: NEGATIVE
HX URINE NITRITE: NEGATIVE
HX URINE PH: 7 (ref 5.0–8.0)
HX URINE PROTEIN: NEGATIVE
HX URINE RBC: 1 /HPF (ref 0.0–2.0)
HX URINE SPECIFIC GRAVITY: 1.02 (ref 1.003–1.03)
HX URINE SQUAMOUS EPITHELIAL: 2 /HPF (ref 0.0–5.0)
HX URINE TRANSITIONAL CELLS: 1 /HPF — ABNORMAL HIGH (ref 0–0)
HX URINE WBC: 27 /HPF — ABNORMAL HIGH (ref 0.0–5.0)
HX UROBILINOGEN, URINE: NEGATIVE

## 2018-06-23 LAB — HX CBC W/DIFF
HX ABSOLUTE BASO COUNT AUTODIFF: 0.06 10*3/uL (ref 0.0–0.22)
HX ABSOLUTE EOS COUNT AUTODIFF: 0.1 10*3/uL (ref 0.0–0.45)
HX ABSOLUTE LYMPHS COUNT AUTODIFF: 2.36 10*3/uL (ref 0.74–5.04)
HX ABSOLUTE MONO COUNT AUTODIFF: 1.12 10*3/uL (ref 0.0–1.34)
HX ABSOLUTE NEUTRO COUNT AUTODIFF: 6.28 10*3/uL (ref 1.48–7.95)
HX BASOPHIL AUTOMATED: 0.6 %
HX EOSINOPHIL AUTOMATED: 1 %
HX HEMATOCRIT: 41.8 % (ref 36.0–47.0)
HX HEMOGLOBIN: 14.3 g/dL (ref 11.8–16.0)
HX IG AUTOMATED: 0.3 % (ref 0.0–2.0)
HX LYMPHOCYTE AUTOMATED: 23.7 %
HX MEAN CORP.HEMO.CONC.: 34.2 g/dL (ref 31.0–37.0)
HX MEAN CORPUSCULAR HEMOGLOBIN: 30.2 pg (ref 26.0–34.0)
HX MEAN CORPUSCULAR VOLUME: 88.4 fL (ref 80.0–100.0)
HX MEAN PLATELET VOLUME: 10.1 fL (ref 9.4–12.4)
HX MONOCYTE AUTOMATED: 11.3 %
HX NEUTROPHIL AUTOMATED: 63.1 %
HX PLATELET COUNT: 287 10*3/uL (ref 150.0–400.0)
HX RED BLOOD COUNT: 4.73 M/uL (ref 3.9–5.2)
HX RED CELL DISTRIBUTION WIDTH SD: 39.4 fL (ref 35.0–51.0)
HX WHITE BLOOD COUNT: 10 10*3/uL (ref 3.7–11.2)

## 2018-06-23 LAB — HX COMPREHENSIVE METABOLIC PANEL
HX ALBUMIN: 3.8 g/dL (ref 3.2–5.0)
HX ALKALINE PHOSPHATASE: 67 U/L (ref 30.0–117.0)
HX ALT: 30 U/L (ref 6.0–55.0)
HX ANION GAP: 5 mmol/L (ref 3.0–11.0)
HX AST: 12 U/L (ref 6.0–40.0)
HX BICARBONATE: 31 mmol/L (ref 21.0–32.0)
HX BUN/CREAT RATIO: 16.1 (ref 12.0–20.0)
HX BUN: 18 mg/dL (ref 7.0–23.0)
HX CALCIUM: 9.6 mg/dL (ref 8.5–10.5)
HX CHLORIDE: 101 mmol/L (ref 98.0–110.0)
HX CREATININE: 1.14 mg/dL (ref 0.4–1.3)
HX GLOMERULAR FR AFRICAN AMERICAN: 58
HX GLOMERULAR FR NON AFRICAN AMER: 48
HX GLUCOSE: 119 mg/dL — ABNORMAL HIGH (ref 70.0–110.0)
HX POTASSIUM: 3.3 mmol/L — ABNORMAL LOW (ref 3.6–5.2)
HX SODIUM: 137 mmol/L (ref 136.0–146.0)
HX TOTAL BILIRUBIN: 0.5 mg/dL (ref 0.2–1.2)
HX TOTAL PROTEIN: 7.2 g/dL (ref 6.0–8.4)

## 2018-06-23 NOTE — Consults (Signed)
 [facility] CONSULTATION REPORT         Rpt # [id number]                              [facility]                                 [address]                                  [place], [place] [address]                                    [phone]  Patient:            Kathy Morales, Kathy Morales                   DOB:                05-06-2056  Pt. Location:       [facility]       MR #:               [id number]         Account # [id number]  Admission Date:     06/23/18   Attending M.D.:     Belinda Block MD              Primary Care:       Rella Larve MD            Report Status:      Signed     ______________________________________________________________________                                                                  [facility] CONSULTATION REPORT                  DATE OF CONSULTATION:  06/23/2018     CONSULTING PHYSICIAN:  Otto Herb, PA     REFERRING PROVIDER:  Ellwood Sayers, MD     REASON FOR CONSULT:  Right sided low back pain with right groin and proximal   anterior thigh pain.     HISTORY OF PRESENT ILLNESS:  This is a 63 year old female with a history of   L4-L5 fusion with Dr. Charlton Amor on 05/06/2016, who presented to the ER with the   above-mentioned complaints.  She states last Sunday, January 19th, she was at   the gym and began to notice low back pain.  The next day she was unable to   perform a simple squat in her kitchen and felt onset of severe posterior right   knee pain.  She states in the beginning of the week her symptoms improved, but   then at approximately 3 p.m. yesterday she noticed pain in the right groin and   low back as well as sometimes extending into the anterior proximal thigh.  She   describes the pain as sharp and severe and although she denies any weakness,  she endorses pain inhibition.  At presentation she is accompanied by her husband who corroborates her story.   When asked to point to the primary region of pain, she points toward the right   side of her low back, the right  buttock, and right groin, as well as the   anterior proximal thigh.  She denies any associated numbness and tingling, only   a sharp severe pain.  She denies any weakness in her arms or legs, although she   does endorse pain inhibition with ambulation.  The left leg is completely   asymptomatic.  No symptoms travel beyond the upper portion of the anterior   thigh.  She reports increased pain with hip flexion.  She denies any fevers or   chills, no recent unexplained weight loss, no bowel or bladder incontinence, no   saddle anesthesia.  She denies any recent falls or trauma preceding the onset   of symptoms.     As part of the workup the patient underwent a CT abdomen and pelvis to evaluate   for appendicitis. Given this study was normal she underwent an MRI of the  lumbar spine +/- contrast and neurosurgery was consulted to interpret these  findings and provide further recommendations.      PAST MEDICAL HISTORY:  Please refer to ER history and physical.     CURRENT MEDICATION LIST:  Please refer to ER history and physical.     Rpt # [id number]  [facility]     [facility] CONSULTATION REPORT            PatientINEZE, Kathy Morales                   MR #:    [id number]    __________________________________________________________________________        ALLERGIES:  Please refer to ER history and physical.     REVIEW OF SYSTEMS:  All systems reviewed and negative except as noted above in   the HPI.     PHYSICAL EXAMINATION:  VITALS:  Temperature 97.7, heart rate 105, blood pressure 123/88, respiratory   rate 18, O2 sats 100% on room air.  GENERAL:  This is an otherwise well-appearing female in no acute distress.  She   is lying on the gurney and is accompanied by her husband.  She is alert and   oriented.  NEUROLOGIC:  She exhibits full range of motion of bilateral upper and lower   extremities with 5/5 strength throughout.  Specifically, upper extremities,   biceps, deltoid, triceps, hand grip, and hand interossei 5/5  bilaterally.   Lower extremities, quadriceps, iliopsoas, dorsiflexion, plantar flexion and EHL   5/5 bilaterally.  She exhibits full sensation to light touch throughout   bilateral upper and lower extremities.  Negative Hoffman's.  Negative clonus.   Babinski is downgoing bilaterally.  Deep tendon reflexes 1+ bilateral Achilles,   1+ right patellar reflex, 2+ left.  MUSCULOSKELETAL:  The patient is nontender to palpation throughout the right low   back as well as the lumbosacral spinous processes.     RADIOGRAPHIC:  I personally reviewed the lumbar spine MRI with and without   contrast, as did Dr. Lauris Chroman.  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  There is also evidence of a right   soft tissue abnormality along the medial aspect of the right psoas muscle which   shows increased signal on STIR sequence as well as gadolinium enhancement   without  evidence of diskitis or epidural abscess.     LAB WORK:  White blood cell count 10, hemoglobin 14.3, hematocrit 41.8,   platelets 287.  Sodium 137, potassium 3.3, chloride 101, bicarb 31, BUN 18,   creatinine 1.14, glucose 119.     ASSESSMENT AND PLAN:  This is a 63 year old female who presented to the ER with   complaints of right-sided low back pain, buttock, groin, and proximal anterior   thigh pain that initially began 6 days ago, but has progressed, particularly   since yesterday afternoon.  Physical exam is nonfocal.  She exhibits full   strength and sensation throughout bilateral lower extremities.  MRI lumbar   spine with and without contrast shows a prior L4-5 PLIF with a moderate degree   of spinal stenosis above the level of the fusion at L3-4 as well as soft tissue   abnormality along the medial aspect of the right psoas.  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  To ensure stability of her fusion we subsequently requested   that the patient  obtain standing lumbar flexion-extension views to ensure there   is no evidence of translation with dynamic movement.  This imaging study   confirmed that there was no subluxation with flexion or extension.  We   recommended that the patient follow up with her original surgeon Dr. Charlton Amor   at [facility].  Given that this does not appear to be a radicular pattern of pain   there is no indication for steroids at this time.  Instead we recommend that   she be provided pain control with oral medications and follow up with her     Rpt # [id number]  [facility]     [facility] CONSULTATION REPORT            Patient: Kathy Morales, Kathy Morales                   MR #:    [id number]    __________________________________________________________________________     original surgeon Dr. Charlton Amor. Pain reilef was adequately acquired in the ER  and the patient was ultimately discharged home. This case was discussed  directly with Dr. Chandra Batch who agreed with the plan and coordinated her care.     Thank you for this consultation.  Please feel free to contact our service with   any additional questions or concerns.     TIME SPENT:  I spent 80 minutes with this patient, greater than 50% of which was   spent in consultation and coordination of care.           ________________________________________  Dictated By:  Otto Herb, PA  D:  06/23/2018 15:31   T:  06/23/2018 16:26  Job#/Voice#:  [id number]   NF/LB       THIS DOCUMENT IS A DRAFT AND WAS REPRODUCED FROM ITS ELECTRONIC FORM FOR  INFORMATIONAL PURPOSES ONLY.  THIS DRAFT SHOULD NOT, UNDER ANY CIRCUMSTANCES,   BE CONSIDERED A COMPLETE AND FINAL REPRODUCTION OF THE LEGAL MEDICAL RECORD               UNLESS AUTHENTICATED BY THE RESPONSIBLE AUTHOR.                                        ELECTRONICALLY SIGNED  Otto Herb, PA                                                    06/26/18    1258

## 2018-07-27 ENCOUNTER — Ambulatory Visit (HOSPITAL_BASED_OUTPATIENT_CLINIC_OR_DEPARTMENT_OTHER)

## 2018-08-14 ENCOUNTER — Ambulatory Visit

## 2018-08-14 LAB — HX ELECTROLYTES
HX ANION GAP: 7 (ref 3.0–11.0)
HX BICARBONATE: 32 mmol/L (ref 21.0–32.0)
HX CHLORIDE: 101 mmol/L (ref 98.0–110.0)
HX POTASSIUM: 3.2 mmol/L — ABNORMAL LOW (ref 3.6–5.2)
HX SODIUM: 140 mmol/L (ref 136.0–146.0)

## 2018-08-14 LAB — HX BUN: HX BUN: 16 mg/dL (ref 8.0–23.0)

## 2018-08-14 LAB — HX CREATININE & EGFR
HX CREATININE: 0.75 mg/dL (ref 0.55–1.3)
HX GLOMERULAR FR AFRICAN AMERICAN: >90
HX GLOMERULAR FR NON AFRICAN AMER: 86

## 2018-08-17 ENCOUNTER — Ambulatory Visit

## 2019-06-20 ENCOUNTER — Ambulatory Visit (HOSPITAL_BASED_OUTPATIENT_CLINIC_OR_DEPARTMENT_OTHER): Admitting: Pulmonary Disease

## 2019-10-22 ENCOUNTER — Ambulatory Visit (HOSPITAL_BASED_OUTPATIENT_CLINIC_OR_DEPARTMENT_OTHER)

## 2019-11-08 ENCOUNTER — Ambulatory Visit

## 2019-11-27 ENCOUNTER — Ambulatory Visit

## 2019-11-27 LAB — HX CREATININE & EGFR
HX CREATININE: 0.83 mg/dL (ref 0.4–1.3)
HX GLOMERULAR FR AFRICAN AMERICAN: 84
HX GLOMERULAR FR NON AFRICAN AMER: 69

## 2019-11-27 LAB — HX BUN: HX BUN: 12 mg/dL (ref 7.0–23.0)

## 2020-01-13 ENCOUNTER — Ambulatory Visit: Admitting: Physician Assistant

## 2020-01-30 ENCOUNTER — Ambulatory Visit

## 2020-02-25 ENCOUNTER — Ambulatory Visit: Admit: 2020-02-25 | Payer: HMO

## 2020-02-25 ENCOUNTER — Ambulatory Visit: Admitting: Physical Medicine & Rehabilitation

## 2020-02-25 ENCOUNTER — Ambulatory Visit (HOSPITAL_BASED_OUTPATIENT_CLINIC_OR_DEPARTMENT_OTHER): Admitting: Psychiatry

## 2020-02-25 NOTE — Progress Notes (Signed)
 * * *    Kathy Morales, Kathy Morales **DOB:** August 27, 1955 (64 yo F) **Acc No.** [id number] **DOS:**  02/25/2020    ---       Kathy Morales**    ------    70 Y old Female, DOB: [birth year], External MRN: [id number]    Account Number: [id number]    [address] [name], [place]    Home: [phone]    Insurance: HMO BLUE OUT IPA Payer ID: PAPER    PCP: Rella Larve Referring: [name], MD External Visit ID:  [id number]    Appointment Facility: [facility]        * * *    02/25/2020 Progress Notes: Cora Collum **CHN#:** [id number]    ------    ---       **Current Medications**    ---    Taking    * Albuterol Sulfate 108 (90 Base) MCG/ACT Aerosol Powder Breath Activated 2 puffs as needed Inhalation every 6 hrs    ---    * Ativan 1 MG Tablet 1 tablet at bedtime as needed Orally Once a day, Notes: takes nightly    ---    * Calcium 1 tab Oral daily    ---    * Esomeprazole Sodium 40 MG Solution Reconstituted as directed Intravenous     ---    * Fish Oil (omega-3 fatty acids)     ---    * HCTZ (hydrochlorothiazide) 50 mg twice daily    ---    * Metformin 500 mg 1 tab Oral twice daily    ---    * potassium     ---    * RANITIDINE 150 MG TABLET     ---    * Simvastatin 80 MG Tablet 1 tablet in the evening Orally Once a day    ---    * Vitamin D once a day    ---    Not-Taking/PRN    * Advair HFA 230-21 MCG/ACT Aerosol USE 2 INHALATIONS TWICE A DAY     ---    * Azithromycin 500 mg Tablet 1 tab Orally Every MWF    ---    * Flonase 50 MCG/ACT Suspension 1 spray in each nostril Nasally Once a day    ---    * flovent 100MG  3 tabs twice daily    ---    * Magnesium     ---    * Montelukast Sodium 10 mg Tablet TAKE 1 TABLET DAILY     ---    * Provigil 100 mg Tablet 3 tablet in the morning Orally Once a day    ---    * Singulair 10 mg Tablet 1 tablet Orally Once a day    ---    Discontinued    * Pantoprazole Sodium 40 MG Tablet Delayed Release 1 tablet Orally twice daily    ---    Medication List reviewed and  reconciled with the patient    ---     Past Medical History    ---      Hypercholesterolemia.        ---    Hypertention.        ---    NIDDM.        ---    GERD.        ---    Asthma.        ---    Breast cancer.        ---      **  Surgical History**    ---      Mastectomy for breast CA 2004    ---    L4-L5 TLIF [facility] Dr. Charlton Amor 05/06/16    ---      **Family History**    ---      Mother: alive, diagnosed with Diabetes mellitus without mention of  complication, type II or unspecified type, not stated as uncontrolled,  Hypertension, Unspecified heart disease    ---    Father: alive, diagnosed with Diabetes mellitus without mention of  complication, type II or unspecified type, not stated as uncontrolled,  Hypertension, Unspecified heart disease    ---    Per pt no FH of asthma (save for husband).    ---      **Social History**    ---    Tobacco history: Never smoked.    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  Lives at home independently with her husband. No  longer using a cane to ambulate. Never smoker. Rare etoh use. No illicits. No  exposure to mold. No pets (formerly had a cat). No change in housing recently,  most recent travel to [place] in 07/2016.    ---      **Allergies**    ---      aspirin    ---    NSAIDs    ---    Forrestine Him Verified]      **Hospitalization/Major Diagnostic Procedure**    ---      No Hospitalization History.    ---      **Review of Systems**    ---     _ORT_ :    Eyes No. Ear, Nose Throat No. Digestion, Stomach, Bowel No. Bladder Problems  No. Bleeding Problems No. Numbness/Tingling No. Anxiety/Depression No.  Fever/Chills/Fatigue No. Chest Pain/Tightness/Palpitations No. Skin Rash No.  Dental Problems No. Joint/Muscle Pain/Cramps Yes. Blackout/Fainting No. Other  No.          **Reason for Appointment**    ---      1\. RT HIP PAIN PER ROGERSON    ---      **History of Present Illness**    ---     _GENERAL_ :    Referred  by Dr Charlton Amor.    I had the pleasure of seeing [name] for initial evaluation of right  sided anterior hip pain. She is s/p L4-5 TLIF on 05-06-2016 with Dr Charlton Amor  with complete relief of spine pain. She had one episode of right sided lower  quadrant pain a couple years back, at which time imaging at [facility] revealed a soft tissue mass, which on follow-up imaging a couple  weeks later, was gone. Then she had a new episode of anterior groin/hip pain  ~1 month ago that was severe for 2 weeks before it resolved. She did not seek  out any medical treatment at this time because she thought it was related to  the prior episode, which had also resolved on its own. Currently, pain is no  longer present, but was located in anterior groin without radiation. It was  described as sharp, stabbing pain and graded 10/10. She was unable to tolerate  any exercises or walking due to pain. She denies any numbness, tingling or  weakness in the right leg. She denies bowel or bladder dysfunction. She has  not had any hip imaging previously.    Of note, she had a severe GI bleed after being on  ibuprofen, and so does not  use any NSAIDs anymore. She also reported having had 2 prior hip injections  which provided significant relief.      **Vital Signs**    ---    Pain scale 4, Ht-in 63.6, Wt-lbs 157, BMI **27.29** , BSA **1.79** , Wt-kg  **71.22** , Wt Change 17.2 lb.      **Physical Examination**    ---    General: NAD    Psych: Mood and affect appropriate    HEENT: NC/AT    CV: warm and well perfused    Pulm: breathing unlabored, normal chest expansions    Skin: No rash, swelling, ecchymoses, or erythema    Gait: not antalgic    Hip:    Inspection: No gross effusions, arthritic deformities are noted    ROM: FAROM b/l LEs without pain    Palpation: mild TTP of greater trochanter, (-) TTP of ischial tuberosity,  anterior groin, RLQ    Provocative: (-) FABER/FADIR, (-) Stinchfield, (-) Hip scouring    Focused  Ultrasound Examination of Right Hip performed on 02-25-2020: did not  reveal any increased fluid under the iliopsoas, nor any increased fluid  collection at the hip joint or capsule.      **Assessments**    ---    1\. Right hip pain - M25.551 (Primary)    ---     ALEXANDR OEHLER is a 64 year old female with right anterior groin pain. Her  pain may be due to iliopsoas bursitis vs hip OA.    -Pt educated regarding diagnoses and treatment options     -Tiers of treatment discussed     -Continue working with personal trainer/PT, with HEP as tolerated     -At next exacerbation, consider application of Voltaren gel TID PRN; we discussed risks, benefits and adverse effects, all questions answered    -Cool compresses or warm heat to affected regions prn     -X-rays of BILATERAL HIPS ordered     -Discussed red flag symptoms such as focal neurologic deficit, bowel or bladder dysfunction and to report to ED if they develop these symptoms    -Follow up PRN; patient was advised to call the next time she has such severe pain so we can consider MRI to see if there is any iliopsoas bursal inflammation, tear or other hip pathology.    A total of 45 minutes was spent preparing for and during this encounter  including: review of imaging, performing medically appropriate examination,  counseling/education regarding treatment plans/injections and clinical  documentation into the electronic health record.    Thank you for letting me participate in this patient's care.    ---      **Treatment**    ---      **1\. Right hip pain**    _IMAGING: DX HIP BILAT AP + LAT W/PELVIS_   eval for hip OA    ------      **Follow Up**    ---    PRN    Electronically signed by Cora Collum on 02/25/2020 at 02:59 PM EDT    Sign off status: Completed        * * *        [facility]    [address]    [place] [address]    [place], [place] [address]    Tel: [phone]    Fax: [phone]              * * *  Progress Note: Camika Marsico  [facility] 02/25/2020    ---    Note generated by eClinicalWorks EMR/PM Software ([email or web address])

## 2020-02-25 NOTE — Progress Notes (Signed)
 .  Progress Notes  .  Patient: Kathy Morales, Kathy Morales  Provider: Cora Collum    .  DOB: [birth year] Age: 64 Y Sex: Female  .  PCP: Rella Larve   Date: 02/25/2020  .  --------------------------------------------------------------------------------  .  REASON FOR APPOINTMENT  .  1. RT HIP PAIN PER ROGERSON  .  HISTORY OF PRESENT ILLNESS  .  GENERAL:  Referred by Dr Charlton Amor.I had the  pleasure of seeing Kathy Morales for initial evaluation of right  sided anterior hip pain. She is s/p L4-5 TLIF on 05-06-2016 with  Dr Charlton Amor with complete relief of spine pain. She had one  episode of right sided lower quadrant pain a couple years back,  at which time imaging at [facility] revealed a  soft tissue mass, which on follow-up imaging a couple weeks  later, was gone. Then she had a new episode of anterior groin/hip  pain   1 month ago that was severe for 2 weeks before it resolved.  She did not seek out any medical treatment at this time because  she thought it was related to the prior episode, which had also  resolved on its own. Currently, pain is no longer present, but  was located in anterior groin without radiation. It was described  as sharp, stabbing pain and graded 10/10. She was unable to  tolerate any exercises or walking due to pain. She denies any  numbness, tingling or weakness in the right leg. She denies bowel  or bladder dysfunction. She has not had any hip imaging  previously. Of note, she had a severe GI bleed after being on  ibuprofen, and so does not use any NSAIDs anymore. She also  reported having had 2 prior hip injections which provided  significant relief.  .  CURRENT MEDICATIONS  .  Taking Albuterol Sulfate 108 (90 Base) MCG/ACT Aerosol Powder  Breath Activated 2 puffs as needed Inhalation every 6 hrs  Taking Ativan 1 MG Tablet 1 tablet at bedtime as needed Orally  Once a day, Notes: takes nightly  Taking Calcium 1 tab Oral daily  Taking Esomeprazole Sodium 40 MG Solution  Reconstituted as  directed Intravenous  Taking Fish Oil (omega-3 fatty acids)  Taking HCTZ (hydrochlorothiazide) 50 mg twice daily  Taking Metformin 500 mg 1 tab Oral twice daily  Taking potassium  Taking RANITIDINE 150 MG TABLET  Taking Simvastatin 80 MG Tablet 1 tablet in the evening Orally  Once a day  Taking Vitamin D once a day  Not-Taking/PRN Advair HFA 230-21 MCG/ACT Aerosol USE 2  INHALATIONS TWICE A DAY  Not-Taking/PRN Azithromycin 500 mg Tablet 1 tab Orally Every MWF  Not-Taking/PRN Flonase 50 MCG/ACT Suspension 1 spray in each  nostril Nasally Once a day  Not-Taking/PRN flovent 100MG  3 tabs twice daily  Not-Taking/PRN Magnesium  Not-Taking/PRN Montelukast Sodium 10 mg Tablet TAKE 1 TABLET  DAILY  Not-Taking/PRN Provigil 100 mg Tablet 3 tablet in the morning  Orally Once a day  Not-Taking/PRN Singulair 10 mg Tablet 1 tablet Orally Once a day  Discontinued Pantoprazole Sodium 40 MG Tablet Delayed Release 1  tablet Orally twice daily  Medication List reviewed and reconciled with the patient  .  PAST MEDICAL HISTORY  .  Hypercholesterolemia  Hypertention  NIDDM  GERD  Asthma  Breast cancer  .  ALLERGIES  .  aspirin  NSAIDs  .  SURGICAL HISTORY  .  Mastectomy for breast CA 2004  L4-L5 TLIF [facility] Dr. Charlton Amor 05/06/16  .  FAMILY HISTORY  .  Mother: alive, diagnosed with Diabetes mellitus without mention  of complication, type II or unspecified type, not stated as  uncontrolled, Hypertension, Unspecified heart disease  Father: alive, diagnosed with Diabetes mellitus without mention  of complication, type II or unspecified type, not stated as  uncontrolled, Hypertension, Unspecified heart disease  Per pt no FH of asthma (save for husband).  .  SOCIAL HISTORY  .  .  Tobaccohistory:Never smoked.  .  Work/Occupation: employed full-time, Engineer, site..  .  Alcohol Yes but rarely.  .  Lives at home independently with her husband. No longer using a  cane to ambulate. Never smoker. Rare etoh use. No illicits.  No  exposure to mold. No pets (formerly had a cat). No change in  housing recently, most recent travel to [place] in 07/2016.  [name]  HOSPITALIZATION/MAJOR DIAGNOSTIC PROCEDURE  .  No Hospitalization History.  [name]  REVIEW OF SYSTEMS  .  ORT:  .  Eyes    No . Ear, Nose Throat    No . Digestion, Stomach, Bowel     No . Bladder Problems    No . Bleeding Problems    No .  Numbness/Tingling    No . Anxiety/Depression    No .  Fever/Chills/Fatigue    No . Chest Pain/Tightness/Palpitations     No . Skin Rash    No . Dental Problems    No . Joint/Muscle  Pain/Cramps    Yes . Blackout/Fainting    No . Other    No .  .  VITAL SIGNS  .  Pain scale 4, Ht-in 63.6, Wt-lbs 157, BMI 27.29, BSA 1.79, Wt-kg  71.22, Wt Change 17.2 lb.  .  PHYSICAL EXAMINATION  .  General: NAD Psych: Mood and affect appropriate HEENT: NC/AT CV:  warm and well perfused Pulm: breathing unlabored, normal chest  expansions Skin: No rash, swelling, ecchymoses, or erythemaGait:  not antalgicHip: Inspection: No gross effusions, arthritic  deformities are noted ROM: FAROM b/l LEs without painPalpation:  mild TTP of greater trochanter, (-) TTP of ischial tuberosity,  anterior groin, RLQ Provocative: (-) FABER/FADIR, (-)  Stinchfield, (-) Hip scouringFocused Ultrasound Examination of  Right Hip performed on 02-25-2020: did not reveal any increased  fluid under the iliopsoas, nor any increased fluid collection at  the hip joint or capsule.  .  ASSESSMENTS  .  Right hip pain - M25.551 (Primary)  .  Kathy Morales is a 64 year old female with right anterior groin  pain. Her pain may be due to iliopsoas bursitis vs hip OA.-Pt  educated regarding diagnoses and treatment options -Tiers of  treatment discussed -Continue working with personal trainer/PT,  with HEP as tolerated -At next exacerbation, consider application  of Voltaren gel TID PRN; we discussed risks, benefits and adverse  effects, all questions answered-Cool compresses or warm heat to  affected regions prn -X-rays  of BILATERAL HIPS ordered -Discussed  red flag symptoms such as focal neurologic deficit, bowel or  bladder dysfunction and to report to ED if they develop these  symptoms-Follow up PRN; patient was advised to call the next time  she has such severe pain so we can consider MRI to see if there  is any iliopsoas bursal inflammation, tear or other hip  pathology.A total of 45 minutes was spent preparing for and  during this encounter including: review of imaging, performing  medically appropriate examination, counseling/education regarding  treatment plans/injections and clinical documentation into the  electronic health record. Thank you for letting me participate in  this patient's care.  .  TREATMENT  .  Right hip pain  DX HIP BILAT AP + LAT [id number] eval for hip OA  .  FOLLOW UP  .  PRN  .  Electronically signed by Cora Collum on 02/25/2020  at 02:59 PM EDT  .  Document electronically signed by Cora Collum    .

## 2020-08-06 ENCOUNTER — Ambulatory Visit: Admitting: Otolaryngology

## 2020-08-26 NOTE — Progress Notes (Signed)
* * *        **  [name]**    --- ---    81 Y old Female, DOB: [birth year]    [address] [place], [place] [address]    Home: [phone]    Provider: Alfredo Batty        * * *    Telephone Encounter    ---    Answered by   Ewing Schlein  Date: 01/17/2018         Time: 05:25 PM    Message                      Need to call patient with her CT scan results once radiology reads report        --- ---            Action Taken                      [facility]  01/18/2018 3:19:59 PM > Reports shows stable findings, no new nodules, no need for repeat CT. Called pt x2 but did not answer phone.      STUEWE,ELENA  01/19/2018 11:56:10 AM > Called again, pt did not answer, left voicemail                    * * *                ---          * * *          Patient: RAIGAN, BARIA DOB: 03/07/1956 Provider: Sydnee Levans R 01/17/2018    ---    Note generated by eClinicalWorks EMR/PM Software ([email or web address])

## 2020-08-26 NOTE — Progress Notes (Signed)
* * *        **[name]**    --- ---    63 Y old Female, DOB: 1956/01/26, External MRN: [id number]    Account Number: [id number]    [address] [name], [place]    Home: [phone]    Insurance: HMO BLUE OUT IPA    PCP: Rella Larve Referring: Rella Larve    Appointment Facility: Pulmonology        * * *    01/17/2018  Progress Notes: Preslyn Warr Minus Breeding, MD **CHN#:** [phone]    --- ---    ---         **Reason for Appointment**    ---       1\. CT Scan follow up and cough    ---       **History of Present Illness**    ---     _GENERAL_ :    Ms. Jeancharles is a 85 yoF with history of severe, persistent asthma, GERD and  upper airway cough syndrome here for follow up of CT scan. Patient states that  her former dry constant cough has actually completely resolved. However, she  has developed a different type of rattling cough over the last several months  which occurs 3-4x daily and is productive of small amount of mucus. Has not  noticed any triggers which seem to bring on the cough, but it generally does  not occur at nighttime. States that her breathing is otherwise doing very  well; almost never has to use albuterol inhaler. Using flonase, advair, and  singulair as directed. No fevers or chills. Intentional weight loss of 40lbs  over the last year. Acid reflux well controlled at present. She is overall  feeling well and wishes to follow up on repeat CT scan for pulmonary nodule.       **Current Medications**    ---    Taking     * Advair HFA 230-21 MCG/ACT Aerosol 2 puffs Inhalation Twice a day    ---    * Albuterol Sulfate 108 (90 Base) MCG/ACT Aerosol Powder Breath Activated 2 puffs as needed Inhalation every 6 hrs    ---    * Ativan 1 MG Tablet 1 tablet at bedtime as needed Orally Once a day, Notes: takes nightly    ---    * Calcium 1 tab Oral daily    ---    * Fish Oil (omega-3 fatty acids)     ---    * Flonase 50 MCG/ACT Suspension 1 spray in each nostril Nasally Once a day    ---    * HCTZ  (hydrochlorothiazide) 50 mg twice daily    ---    * Magnesium     ---    * Metformin 500 mg 1 tab Oral twice daily    ---    * Pantoprazole Sodium 40 MG Tablet Delayed Release 1 tablet Orally twice daily    ---    * potassium     ---    * RANITIDINE 150 MG TABLET     ---    * Simvastatin 80 MG Tablet 1 tablet in the evening Orally Once a day    ---    * Singulair 10 mg Tablet 1 tablet Orally Once a day    ---    * Vitamin D once a day    ---    Not-Taking/PRN    * Azithromycin 500 mg Tablet 1 tab Orally Every  MWF    ---    * flovent 100MG  3 tabs twice daily    ---    * Provigil 100 mg Tablet 3 tablet in the morning Orally Once a day    ---    Discontinued    * Advair HFA 230-21 MCG/ACT Aerosol 2 puffs Inhalation Twice a day    ---    * Singulair 10 MG Tablet 1 tablet Orally Once a day    ---    * Medication List reviewed and reconciled with the patient    ---       **Past Medical History**    ---       Hypercholesterolemia.        ---    Hypertention.        ---    NIDDM.        ---    GERD.        ---    Asthma.        ---    Breast cancer.        ---       **Surgical History**    ---       Mastectomy for breast CA 2004    ---    L4-L5 TLIF [facility] Dr. Charlton Amor 05/06/16    ---       **Family History**    ---       Mother: alive, diagnosed with Diabetes, Hypertension, Heart Disease    ---    Father: alive, Diabetes, Hypertension, Heart Disease    ---    Per pt no FH of asthma (save for husband).    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    Tobacco  history: Never smoked.   Lives at home independently with her  husband. No longer using a cane to ambulate. Never smoker. Rare etoh use. No  illicits. No exposure to mold. No pets (formerly had a cat). No change in  housing recently, most recent travel to [place] in 07/2016.    ---       **Allergies**    ---       aspirin    ---    NSAIDs    ---       **Hospitalization/Major Diagnostic  Procedure**    ---       No Hospitalization History.    ---       **Review of Systems**    ---     _ADULT Pulmonary_ :    Constitutional no fever, chills, night sweats. Eyes no vision changes. HENT no  nasal congestion, runny nose. Respiratory as per HPI. Cardiovascular no chest  pain, palpitations. Gastrointestinal chronic reflux. Genitourinary no  difficulty urinating. Musculoskeletal _no joint pain_. Neurological no  dizziness. Psychological no depression, anxiety. Endocrine intentional weight  loss. Skin no rash.          **Vital Signs**    ---    Pain scale 0, Ht-in 63.6, Wt-lbs 139.8, BMI 24.30, BP 147/75, HR 95, RR 16,  Temp 98.3, Oxygen sat % 97RA.       **Examination**    ---     _Pulmonary_ :    General Appearance: no apparent distress, alert and oriented x 3.    Oropharynx: mucus membrane moist. Oropharynx without cobblestoning.    Nasal Mucosa: moist, pink, without exudate.  Neck: No stridor. No mass palpated.    Chest Inspection: Normal .    Chest Percussion: No dullness, no hyperresonance.    Breath sounds: without weezes, rhonchi or rales, clear to auscultation  bilaterally.    Respiratory Effort: No use of accessory muscles.    Heart Exam: Regular S1, 2.    Gastrointestinal: nontender, bowel sounds present.    Extremities: No cyanosis, no clubbing, no edema.    Lymphatic: No cervical adenopathy.    Peripheral Vascular System: no leg edema.    Skin:  no rashes or lesions.          **Assessments**    ---    1\. Cough - R05 (Primary)    ---    2\. Asthma - J45.909    ---    3\. Pulmonary nodule - R91.1    ---    4\. GERD (gastroesophageal reflux disease) - K21.9    ---      Ms. Santelli is a 67 yoF with history of severe, persistent asthma, GERD and  upper airway cough syndrome here for follow up of CT scan. Patient states that  her former dry constant cough is now replaced with an infrequent cough  occasionally productive of mucus. Otherwise, well controlled from asthma  standpoint. Will continue  current regimen of flonase, advair, singulair,  albuterol. Although no apparent signs of infection on CT scan, will still give  patient specimen cups for sputum collection as this is a change from her  baseline symptoms to rule out colonization of bacteria or a more chronic  infection. Regarding CT scan for nodule, will follow up formal radiology read  but does not appear to show any changes from prior. We discussed with patient  that given the very small size of the nodule on prior imaging and her lack of  smoking history, that a repeat CT scan is not indicated.    ---       **Treatment**    ---       **1\. Cough**    _LAB: AFB Culture_    _LAB: Sputum Culture+Gram Stain_    _LAB: Fungus Culture_    _LAB: AFB Stain_    Notes: Please give patient cups x3 for sputum samples.    ---        **2\. Asthma**    Refill Advair HFA Aerosol, 230-21 MCG/ACT, 2 puffs, Inhalation, Twice a day,  90 days, 3, Refills 3    Refill Albuterol Sulfate Aerosol Powder Breath Activated, 108 (90 Base)  MCG/ACT, 2 puffs as needed, Inhalation, every 6 hrs, 90 days, 3, Refills 3    Refill Flonase Suspension, 50 MCG/ACT, 1 spray in each nostril, Nasally, Once  a day, 90 days, 3, Refills 3    Refill Singulair Tablet, 10 mg, 1 tablet, Orally, Once a day, 90 days, 90,  Refills 3         **3\. Pulmonary nodule**    Notes: No follow-up imaging needed.      **Follow Up**    ---    prn    Electronically signed by Sydnee Levans , MD on 01/24/2018 at 05:07 PM EDT    Sign off status: Completed        * * *        Pulmonology    [address]    [place], [address]    [place], [place] [address]    Tel: [phone]    Fax: [phone]              * * *  Patient: AMI, MALLY DOB: 15-Mar-1956 Progress Note: Lavaya Defreitas Minus Breeding, MD  01/17/2018    ---    Note generated by eClinicalWorks EMR/PM Software ([email or web address])

## 2020-08-26 NOTE — Progress Notes (Signed)
* * *        **  Kathy Morales**    --- ---    38 Y old Female, DOB: [birth year]    [address] [place], [place] [address]    Home: [phone]    Provider: Otto Herb, MD        * * *    Telephone Encounter    ---    Answered by   Otto Herb  Date: 04/12/2017         Time: 04:19 PM    Refills  Refill Advair HFA Aerosol, 230-21 MCG/ACT, Inhalation, 3, 2 puffs,  Twice a day, 90 days, Refills=3    --- ---          * * *                ---          * * *          Patient: Kathy Morales, Kathy Morales DOB: 10/22/1955 Provider: Otto Herb, MD  04/12/2017    ---    Note generated by eClinicalWorks EMR/PM Software ([email or web address])

## 2020-08-26 NOTE — Progress Notes (Signed)
* * *        **[name]**    --- ---    28 Y old Female, DOB: August 23, 1955, External MRN: [id number]    Account Number: [id number]    [address] [name], [id number]    Home: [phone]    Insurance: HMO BLUE OUT [employer]    PCP: Rella Larve Referring: Rella Larve    Appointment Facility: Pulmonology        * * *    12/14/2016   **Appointment Provider:** Otto Herb, MD **CHN#:** [id number]    --- ---      **Supervising Provider:** AMY Minus Breeding, MD    ---        Reason for Appointment    ---      1\. Ivonne Andrew    ---      History of Present Illness    ---     _GENERAL_ :    PT is a pleasant 17 F with longstanding history of asthma as a child (which er  patient went away) who presents with chronic cough and wheezing and "asthma  exacerbations" since 2014.    Patient reports during her her childhood she had asthma and was treated with  inhalers iften until she felt her asthma attacks grew further apart. She then  reports in summer 2014 she went to [place] where she got a lung infection,  with symptoms of fevers, cough and dyspnea. At that time she did not go to a  hspital, but upon returning home felt every 6 weeks or so she got flares of  dyspnea and cough. OVer this time she got a few bursts of prednisone. Her PCP  during this time advised her to see a pulmonolgist, and she was started on a  SABA nad possibly flovent    Patient reports that he cough and dyspnea symptoms became less frequent and  she went to a trip to [place] MArch 2018. She reports since this trip she has  had hoarse voice, intence coughing, and another episode of fevers (checked  temp). She reports sicne trip she required at least two more prednisone bursts  and tapers (now on 20 mg from most recent burst of 60 mg a few days ago). She  reports her PCP has given her trials of abx to no effect. She reports she  feels post nasal drip, has a barking cough, and is wheezy. She endorses  diffuse skin redness and itchiness as well. She reports  feeling warm sometimes  with cough.      Current Medications    ---    Taking     * Albuterol Sulfate 108 (90 Base) MCG/ACT Aerosol Powder Breath Activated 2 puffs as needed Inhalation every 6 hrs    ---    * Ativan 1 MG Tablet 1 tablet at bedtime as needed Orally Once a day    ---    * Calcium 1 tab Oral daily    ---    * Fish Oil (omega-3 fatty acids)     ---    * flovent 100MG  3 tabs twice daily    ---    * HCTZ (hydrochlorothiazide) 50 mg twice daily    ---    * Metformin 500 mg 1 tab Oral twice daily    ---    * Pantoprazole Sodium 40 MG Tablet Delayed Release 1 tablet Orally twice daily    ---    * Provigil 100 mg Tablet 3  tablet in the morning Orally Once a day    ---    * Simvastatin 80 MG Tablet 1 tablet in the evening Orally Once a day    ---    * Vitamin D once a day    ---      Past Medical History    ---       High cholesterol.        ---    Hypertention.        ---    NIDDM.        ---    GERD.        ---    Asthma.        ---      Surgical History    ---      mastectomy for breast CA 2004    ---    L4-L5 TLIF [facility] Dr. Charlton Amor 05/06/16    ---      Family History    ---      Mother: alive, diagnosed with Diabetes, Hypertension, Heart Disease    ---    Father: alive, diagnosed with Diabetes, Hypertension, Heart Disease    ---    per pt no fh of asthma (save for husband).    ---      Social History    ---    Tobacco    history: _Never smoked_    Work/Occupation: employed full-time, Engineer, site. [name]    Alcohol    _Yes but rarely_   Lives at home independently with her husband. No longer  using a cane to ambulate. Never smoker. rare etoh use. No illicits. No  exposure to mold. No pets (formerly had a cat). No hcange in housing recnetly,  most recent travel to [place] in 07/2016.    ---      Allergies    ---      aspirin    ---    NSAIDs    ---      Review of Systems    ---     _ADULT Pulmonary_ :    Constitutional rare fever as above. Respiratory as per HPI. Cardiovascular no  chest pain,  palpitations. Gastrointestinal no heartburn, reflux.  Musculoskeletal no joint pain. Psychological no depression, anxiety. Skin no  rash.          Vital Signs    ---    Pain scale 0, Ht-in 63.6, Wt-lbs 182, BMI 31.63, BP 121/81, HR 118, RR 16,  Temp 98.7, Oxygen sat % 96RA.      Examination    ---     _GENERAL_ :    General Well-appearing, mild distress, no visible wheezing or tripoding or  difficulty breathing .    Eyes Pupils equal, Round, Full range of motion, No sclera icteris, non-  injected .    Ears, Nose, Mouth and Throat posterrior nasopharyngeal erythema, inferior  turbinate hypertrophy and erythema.    Neck Thyroid normal to palpation, No carotid bruits .    Respiratory b/l wheezing.    Cardiovascular RRR S1 nd S2 ausculted at LUSb and RUSB. No HJR. NO JVP  elevation  .    Lymph nodes No cervical lymphadenopathy .    Musculoskeletal / Neurological Normal gait, no atrophy of the lower or upper  extremities, full range of motion of lower and upper extremities, Good  proximal strength, DTRs 2+ with normal relaxation phase .    Skin No telangectasias, tatoos, or rashes, no nodules, no  rashes or nail  changes .          Physical Examination    ---    PFTS: fev1/fvc .66, FEV1 71 % pred, FVC 83 % pred, post FEV1 and FVC chnaged  +15 and 13 %, postive bronchodilator response. Mild obstruction with  bronchodilator response.      Assessments    ---    1\. Asthma - J45.909 (Primary)    ---    2\. Cough - R05    ---    3\. Breast CA - C50.919    ---      [name] is a pleasant 67 F coming in with complaint of onging  hoarsenss, post nasal drip, and cough and wheezingthat is consistent with  asthma (confirmed with history and PFTs today). At this time her symptoms are  very poorly controlled on only SABA and flovent. Chagned regimen to SABA prn,  Advair 230/21 2 puffs bid, singular and flonase. In addition, given patient's  descirption of symptoms starting after possible infection in [place] in 2014,  as well as  history breast cancer R breast s/p masectomy, will get CT chets to  r/o bronchiectasis or other process. Will have patient f/u n clinic in 6-8  weeks with repeat PFTs with volumes as well.    ---      Treatment    ---       **1\. Asthma**    Start Advair HFA Aerosol, 230-21 MCG/ACT, 2 puffs, Inhalation, Twice a day, 90  days, 3 Inhaler, Refills 3    Start Singulair Tablet, 10 mg, 1 tablet, Orally, Once a day, 90 days, 90  Tablet, Refills 3    Start Flonase Suspension, 50 MCG/ACT, 1 spray in each nostril, Nasally, Once a  day, 90 days, 3, Refills 3    _IMAGING: CT Chest_    _PROCEDURE: Pulmonary Function Test_    Notes: F/U in 6 weeks with pfts with lung volumes in am    HRCT ordered to be done before next appointment. reason: asthma, r/o ild. No  smoking history.    ---      Follow Up    ---    6 Weeks    **Appointment Provider:** Otto Herb, MD    Electronically signed by Sydnee Levans , MD on 02/01/2017 at 03:12 PM EDT    Sign off status: Completed        * * *        Pulmonology    [address]    [place], [place] [address]    Tel: [phone]    Fax: [phone]              * * *          Patient: KYNSLIE, RINGLE DOB: 07-22-1955 Progress Note: Otto Herb, MD  12/14/2016    ---    Note generated by eClinicalWorks EMR/PM Software ([email or web address])

## 2020-08-26 NOTE — Progress Notes (Signed)
* * *        **Kathy Morales**    --- ---    42 Y old Female, DOB: May 31, 1956, External MRN: [id number]    Account Number: [id number]    [address] [name], [id number]    Home: [phone]    Insurance: HMO BLUE OUT [employer]    PCP: Rella Larve Referring: Rella Larve    Appointment Facility: [facility]ics        * * *    06/21/2016  Progress Notes: [name], MD **CHN#:** [phone]    --- ---    ---        Reason for Appointment    ---      1\. s/p L4-5 TLIF on 05/06/16    ---      History of Present Illness    ---     _GENERAL_ Kathy Morales is here today for her second postop follow up. Her leg pain is still  completely resolved. She has back pain and stiffness but the pain is  controlled with 2 tabs of Vicodin BID. She has returned to work and finds that  the her back pain is worse with sitting or standing for long periods of time.  It is better with repositioning. She traveled to [place] since I last saw her  and had no real set backs with that trip. No fever/chills or wound drainage,  no chest pain or shortness of breath.      Current Medications    ---    Taking     * Albuterol Sulfate 108 (90 Base) MCG/ACT Aerosol Powder Breath Activated 2 puffs as needed Inhalation every 6 hrs    ---    * Ativan 1 MG Tablet 1 tablet at bedtime as needed Orally Once a day    ---    * Calcium 1 tab Oral daily    ---    * Dilaudid     ---    * Fish Oil (omega-3 fatty acids)     ---    * flovent 100MG  3 tabs twice daily    ---    * HCTZ (hydrochlorothiazide) 50 mg twice daily    ---    * Hydrocodone-Acetaminophen 5-325 MG Tablet 1-2 tablet as needed Orally every 6 hrs    ---    * Metformin 500 mg 1 tab Oral twice daily    ---    * Pantoprazole Sodium 40 MG Tablet Delayed Release 1 tablet Orally twice daily    ---    * Provigil 100 mg Tablet 3 tablet in the morning Orally Once a day    ---    * Simvastatin 80 MG Tablet 1 tablet in the evening Orally Once a day    ---    * Vitamin D once a day    ---    *  Medication List reviewed and reconciled with the patient    ---      Past Medical History    ---       High cholesterol.        ---    Hypertention.        ---    NIDDM.        ---    GERD.        ---    Asthma.        ---      Surgical History    ---  mastectomy for breast CA 2004    ---      Family History    ---      Mother: alive, diagnosed with Diabetes, Hypertension, Heart Disease    ---    Father: alive, diagnosed with Diabetes, Hypertension, Heart Disease    ---    Non-Contributory    ---      Social History    ---    Tobacco  history: Never smoked.    Work/Occupation: employed full-time, Engineer, site. [name]    Alcohol  Yes but rarely.   Lives at home independently with her husband. No  longer using a cane to ambulate.    ---      Allergies    ---      aspirin    ---    NSAIDs    ---      Review of Systems    ---    Per HPI, otherwise negative.      Vital Signs    ---    Pain scale 2.      Examination    ---     _GENERAL_ :    Upright lumbar spine x-rays were reviewed. Lumbar lordosis is preserved. All  hardware including interbody device is in good position without evidence of  failure. No interval change from preop.          Physical Examination    ---    Patient is well-nourished, well-appearing and in no distress. Ambulates  independently in the office today. Midline lumbar insision is clean dry and  intact. No surrounding erythema or drainage. 5/5 strength in iliopsoas, quads,  tibialis anterior, EHL and gastroc muscle groups. Sensation is intact to light  touch in the L3-S1 nerve distributions bilaterally. Extremities are warm and  well-perfused without any peripheral edema.      Assessments    ---    1\. Lumbar radiculopathy, right - M54.16 (Primary)    ---    2\. 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 S/P lumbar fusion - Z98.1    ---      Treatment    ---       **1\. Lumbar radiculopathy, right**    Notes: All findings were reviewed with  Kathy Morales in the office today. She is  still very pleased with the results of her surgery and has had no recurrence  of leg pain. Her back pain continues to slowly improve. We will hold off on  any PT at this point and likely get her back into outpatient PT at her next  visit when hopefully her back pain is improved. She can take one additional  dose of Tylenol midday if she needs it. I will see her back in the office in 6  weeks for routine follow up with lumbar x-rays. All questions were answered  and she agrees with the plan. She knows to call in the meantime with any new  concners.    ---      Follow Up    ---    6 Weeks with x-rays    Electronically signed by [name] , MD on 06/21/2016 at 10:39 AM EST    Sign off status: Completed        * * *        [facility]    [address]    [place], [place] [address]    Tel: [phone]  Fax: [phone]              * * *          Patient: Kathy Morales, Kathy Morales DOB: [birth year] Progress Note: [name], MD  06/21/2016    ---    Note generated by eClinicalWorks EMR/PM Software ([email or web address])

## 2020-08-26 NOTE — Progress Notes (Signed)
* * *        **Kathy Morales**    --- ---    36 Y old Female, DOB: 1956-05-15, External MRN: [id number]    Account Number: [id number]    [address] [name], [id number]    Home: [phone]    Insurance: H[employer] BLUE OUT [employer]    PCP: Rella Larve Referring: Rella Larve    [facility]        * * *    05/21/2016  Progress Notes: [name], MD **CHN#:** [id number]    --- ---    ---        Reason for Appointment    ---      1\. s/p L4-5 TLIF on 05/06/16    ---      History of Present Illness    ---     _GENERAL_ Kathy Morales is here today for her first postop follow up. Her leg pain is  completely resolved and her UTI symptoms have resolved after a course of  antibiotics. She has back pain and stiffness, worse with sitting and better  with repositioning, and she is taking Dilaudid which helps the pain. She has  stopped taking Zanaflex since it was not helping and was giving her some GERD.  She is planning on going to [place] this week for Christmas. Appetite, bowel  and bladder function are at baseline. No fever/chills or wound drainage.      Current Medications    ---    Taking     * Albuterol Sulfate 108 (90 Base) MCG/ACT Aerosol Powder Breath Activated 2 puffs as needed Inhalation every 6 hrs    ---    * Ativan 1 MG Tablet 1 tablet at bedtime as needed Orally Once a day    ---    * Calcium 1 tab Oral daily    ---    * Dilaudid     ---    * Fish Oil (omega-3 fatty acids)     ---    * flovent 100MG  3 tabs twice daily    ---    * HCTZ (hydrochlorothiazide) 50 mg twice daily    ---    * Hydrocodone-Acetaminophen 5-325 MG Tablet 1-2 tablet as needed Orally every 6 hrs    ---    * Metformin 500 mg 1 tab Oral twice daily    ---    * Pantoprazole Sodium 40 MG Tablet Delayed Release 1 tablet Orally twice daily    ---    * Provigil 100 mg Tablet 3 tablet in the morning Orally Once a day    ---    * Simvastatin 80 MG Tablet 1 tablet in the evening Orally Once a day    ---    * Vitamin D  once a day    ---    * Medication List reviewed and reconciled with the patient    ---      Past Medical History    ---       High cholesterol.        ---    Hypertention.        ---    NIDDM.        ---    GERD.        ---    Asthma.        ---      Surgical History    ---      mastectomy  for breast CA 2004    ---      Family History    ---      Mother: alive, diagnosed with Diabetes, Hypertension, Heart Disease    ---    Father: alive, diagnosed with Diabetes, Hypertension, Heart Disease    ---    Non-Contributory    ---      Social History    ---    Tobacco  history: Never smoked.    Work/Occupation: employed full-time, Engineer, site. [name]    Alcohol  Yes but rarely.   Lives at home independently, recently started using  a cane to ambulate.    ---      Allergies    ---      aspirin    ---    NSAIDs    ---      Review of Systems    ---    Per HPI, otherwise negative.      Vital Signs    ---    Pain scale 4.      Examination    ---     _GENERAL_ :    Upright lumbar spine x-rays were reviewed. Lumbar lordosis is preserved. All  hardware including interbody device is in good position without evidence of  failure.          Physical Examination    ---    Patient is well-nourished, well-appearing and in no distress. Ambulates with  use of a cane in the office today. Midline lumbar insision is clean dry and  intact with nylon sutures. No surrounding erythema or drainage. 5/5 strength  in iliopsoas, quads, tibialis anterior, EHL and gastroc muscle groups.  Sensation is intact to light touch in the L3-S1 nerve distributions  bilaterally. Extremities are warm and well-perfused without any peripheral  edema.      Assessments    ---    1\. Lumbar radiculopathy, right - M54.16 (Primary)    ---    2\. 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 S/P lumbar fusion - Z98.1    ---      Treatment    ---       **1\. Lumbar radiculopathy, right**    Notes: All findings were reviewed  with Kathy Morales in the office today. She is  very happy with the early results of her surgery as her leg pain is completely  resolved. We reassured her that back pain is to be expected at this point  after surgery and she does think it is getting better each day. We provided  her with a refill of Dilaudid (#90) for her trip to [place] this week. I  emphasized that she needs to get up and walk on the plane frequently to  prevent blood clots and to help with her back pain. She can also use ice packs  on the plane. I reminded her of her postop lifting and twisting restrictions.  I will see her back in the office in 4 weeks for routine follow up with lumbar  x-rays. All questions were answered and she agrees with the plan.    ---      Follow Up    ---    4 Weeks with x-rays    Electronically signed by [name] , MD on 05/26/2016 at 09:30 AM EST    Sign off status: Completed        * * *  [facility]    [address]    [place], [place] [address]    Tel: [phone]    Fax: [phone]              * * *          Patient: Kathy Morales, Kathy Morales DOB: 1955/11/02 Progress Note: [name], MD  05/21/2016    ---    Note generated by eClinicalWorks EMR/PM Software ([email or web address])

## 2020-08-26 NOTE — Progress Notes (Signed)
* * *        **  [name]**    --- ---    20 Y old Female, DOB: Feb 28, 1956    [address], [place], [place] [address]    Home: [phone]    Provider: [name]        * * *    Telephone Encounter    ---    Answered by   Larinda Buttery  Date: 05/11/2016         Time: 01:02 PM    Caller   Self    --- ---            Reason   PT            Message                      [facility]-No services set up with PT for P/O care yet.                Action Taken   [name],[name] 05/11/2016 1:03:04 PM > Patient verifying that  she will have a therapist. I called Director of [facility] and he explained that they  would be calling patient either today or tomorrow. Called patient and let her  know.                * * *                ---          * * *          Patient: JAELINE, WHOBREY DOB: 06-16-1955 Provider: [name]  05/11/2016    ---    Note generated by eClinicalWorks EMR/PM Software ([email or web address])

## 2020-08-26 NOTE — Progress Notes (Signed)
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 Patient was  discharged on Sunday, 05/09/16. Patient doing well. Lots of back pain.  Controlled with 2 mg Dilaudid, tizanidine and tylenol and gabapentin. Does not  need any refills at this time. Dressing is C/D/I. Advised to keep covered with  shower, can change dressing tomorrow. No headaches. Has not had a bowel  movement yet. Added MiraLax to senna yesterday. Advised milk of mag if needed.  Encouraged ice and ambulation. Reminded of postop appointment with time and  date. HEALY,BRETT , PA 05/10/2016 8:14:54 AM >                * * *                ---          * * *          Patient: Kathy Morales, Kathy Morales DOB: Dec 31, 1955 Provider: [name]  05/10/2016    ---    Note generated by eClinicalWorks EMR/PM Software ([email or web address])

## 2020-08-26 NOTE — Progress Notes (Signed)
* * *        **  Kathy Morales**    --- ---    89 Y old Female, DOB: 01-02-1956    [address], [place], [place] [address]    Home: [phone]    Provider: [name]        * * *    Telephone Encounter    ---    Answered by   Ellie Lunch  Date: 05/10/2016         Time: 01:28 PM    Caller   patient    --- ---            Reason   UTI            Action Taken   patient has increase urinary frequency and burning with  urination x 3 days. Worse today. Called PCP, hasnt heard back. Has been  treated in the past for UTI with bactrim. No fevers or flank pain. No  hematuria. Encouraged being tested at her PCP, but she states she is in a lot  of pain and PCP is far. Prescribed bactrim 800-160 mg BID x3 days. HEALY,BRETT  , PA 05/10/2016 1:33:55 PM >            Refills  Start Bactrim DS 800-160 mg 3 d Tabs, DS 800-160 mg, orally, 6, one  tab, twice a day, 3 days, Refills=0    --- ---          * * *                ---          * * *          Patient: Kathy Morales, Kathy Morales DOB: 09-22-1955 Provider: [name]  05/10/2016    ---    Note generated by eClinicalWorks EMR/PM Software ([email or web address])

## 2020-08-26 NOTE — Progress Notes (Signed)
* * *        **  Kathy Morales**    --- ---    84 Y old Female, DOB: 18-Jun-1955    [address] [place], [place] [address]    Home: [phone]    Provider: Alfredo Batty        * * *    Telephone Encounter    ---    Answered by   Vilma Meckel  Date: 04/26/2017         Time: 11:34 AM    Action Taken   Vilma Meckel , MD 04/26/2017 11:34:45 AM > Advair and  Montelukast prescriptions sent to her pharmacy    --- ---            Refills  Refill Advair HFA Aerosol, 230-21 MCG/ACT, Inhalation, 3, 2 puffs,  Twice a day, 90 days, Refills=0    --- ---      Refill Singulair Tablet, 10 mg, Orally, 90, 1 tablet, Once a day, 90 days,  Refills=0          * * *                ---          * * *          Patient: Kathy Morales, Kathy Morales DOB: April 07, 1956 Provider: Sydnee Levans R 04/26/2017    ---    Note generated by eClinicalWorks EMR/PM Software ([email or web address])

## 2020-08-26 NOTE — Progress Notes (Signed)
* * *        **  Kathy Morales**    --- ---    51 Y old Female, DOB: 1955-06-10    [address] [place], [place] [address]    Home: [phone]    Provider: [name]        * * *    Telephone Encounter    ---    Answered by   Ellie Lunch  Date: 06/10/2016         Time: 12:26 PM    Caller   patient    --- ---            Reason   medication            Message                      medication                Action Taken   patient left voicemail requesting dilaudid, taking one at  bedtime. Called back twice and left voicemail for patient. Reviewed masspat,  PCP prescribed #120 tablets which was filled on 06/09/16. Waiting for callback  from patient. HEALY,BRETT , PA 06/10/2016 12:28:17 PM >                * * *                ---          * * *          Patient: Kathy Morales, Kathy Morales DOB: July 03, 1955 Provider: [name]  06/10/2016    ---    Note generated by eClinicalWorks EMR/PM Software ([email or web address])

## 2020-08-26 NOTE — Progress Notes (Signed)
* * *        **  Kathy Morales**    --- ---    65 Y old Female, DOB: [birth year]    [address], [place], [place] [address]    Home: [phone]    Provider: [name]        * * *    Telephone Encounter    ---    Answered by   Larinda Buttery  Date: 05/17/2016         Time: 01:23 PM    Caller   [facility]    --- ---            Reason   Call Back            Message                      Pam from [facility] is looking for Dr. Charlton Amor or your approval of Olyvia's PT plan. The CB of the nurse: [phone]                Action Taken   [name],[name] 05/17/2016 1:24:22 PM > PT plan, 3-4x per week,  patient planning on going a trip on saturday. HEALY,BRETT , PA 05/17/2016  2:03:44 PM >                * * *                ---          * * *          Patient: Kathy Morales, Kathy Morales DOB: 02/06/1956 Provider: [name]  05/17/2016    ---    Note generated by eClinicalWorks EMR/PM Software ([email or web address])

## 2020-08-26 NOTE — Progress Notes (Signed)
* * *        **Kathy Morales**    --- ---    77 Y old Female, DOB: [birth year], External MRN: [id number]    Account Number: [id number]    [address] [name], [place]    Home: [phone]    Insurance: HMO [place] [employer]    PCP: Rella Larve Referring: Rella Larve    Appointment Facility: [facility]        * * *    08/23/2016   **Appointment Provider:** Ellie Lunch, PA **CHN#:** [phone]    --- ---      **Supervising Provider:** [name], MD    ---        Reason for Appointment    ---      1\. 3 months s/p L4-5 TLIF on 05/06/16    ---      History of Present Illness    ---     _GENERAL_ :    Kathy Morales is here today for 3 month follow-up s/p L4-L5 TLIF. She was doing  extremely well in early March and became more active at that time with  horseback riding, travel and she recently painted her garage. Since then she  has experienced occasional low back pain, aching sensation and occasional  right buttock pain. She also admits to bilateral, R>L, greater trochanteric  pain, worse with lying on her sides. No radiating leg pain/numbness or  tingling. She denies any new fever, chills, bowel/bladder change.      Current Medications    ---    Taking     * Albuterol Sulfate 108 (90 Base) MCG/ACT Aerosol Powder Breath Activated 2 puffs as needed Inhalation every 6 hrs    ---    * Ativan 1 MG Tablet 1 tablet at bedtime as needed Orally Once a day    ---    * Calcium 1 tab Oral daily    ---    * Fish Oil (omega-3 fatty acids)     ---    * flovent 100MG  3 tabs twice daily    ---    * HCTZ (hydrochlorothiazide) 50 mg twice daily    ---    * Metformin 500 mg 1 tab Oral twice daily    ---    * Pantoprazole Sodium 40 MG Tablet Delayed Release 1 tablet Orally twice daily    ---    * Provigil 100 mg Tablet 3 tablet in the morning Orally Once a day    ---    * Simvastatin 80 MG Tablet 1 tablet in the evening Orally Once a day    ---    * Vitamin D once a day    ---    * Medication List reviewed and reconciled  with the patient    ---      Past Medical History    ---       High cholesterol.        ---    Hypertention.        ---    NIDDM.        ---    GERD.        ---    Asthma.        ---      Surgical History    ---      mastectomy for breast CA 2004    ---    L4-L5 TLIF [facility] Dr. Charlton Amor 05/06/16    ---  Family History    ---      Mother: alive, diagnosed with Diabetes, Hypertension, Heart Disease    ---    Father: alive, diagnosed with Diabetes, Hypertension, Heart Disease    ---      Social History    ---    Tobacco    history: _Never smoked_    Work/Occupation: employed full-time, Engineer, site. [name]    Alcohol    _Yes but rarely_   Lives at home independently with her husband. No longer  using a cane to ambulate.    ---      Allergies    ---      aspirin    ---    NSAIDs    ---      Hospitalization/Major Diagnostic Procedure    ---      No Hospitalization History.    ---      Review of Systems    ---    As per HPI.      Vital Signs    ---    Pain scale 0.      Examination    ---     _GENERAL_ :    Lumbar spine radioraphs from today were reviewed. Hardware in good postion as  well as interbody. No evidence of failure. Alignment maintained.          Physical Examination    ---    Patient is well-nourished, well-appearing and in no distress. Ambulates  independently in the office today. Midline lumbar insision is well healed. No  surrounding erythema or drainage. 5/5 strength in iliopsoas, quads, tibialis  anterior, EHL and gastroc muscle groups. Sensation is intact to light touch in  the L3-S1 nerve distributions bilaterally. Extremities are warm and well-  perfused without any peripheral edema.      Assessments    ---    1\. S/P lumbar fusion - Z98.1 (Primary)    ---    2\. 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 S/P lumbar fusion**    Notes: All findings and imaging were discussed with Kathy Morales. She continues  to do well post-operatively and is  happy with her surgical results. At this  time she has no further restrictions. We recommend she begins out-patient PT  for bilateral greater trochanteric bursa and intermittent low back pain. We  also recommended a cortisone injection today which she would like to proceed  with. Procedure tolerated well. Patient can follow-up in three months for  repeat lumbar spine x-rays and re-evaluation. All questions were answered.    ---      Procedures    ---    Under sterile technique, the right greater trochanteric bursa was injected  today at the point of maximal tenderness with 2 cc Celestone and 3 cc  bupivacaine. A bandaid was applied and the usual post injection instructions  were given. (20610).      Follow Up    ---    3 months with lumbar spine AP/LAT xrays    **Appointment Provider:** Ellie Lunch, PA    Electronically signed by [name] , MD on 08/23/2016 at 11:14 AM EDT    Sign off status: Completed        * * *        [facility]    [address]    [place], [place] [address]  Tel: [phone]    Fax: [phone]              * * *          Patient: Kathy Morales, Kathy Morales DOB: [birth year] Progress Note: Ellie Lunch, PA  08/23/2016    ---    Note generated by eClinicalWorks EMR/PM Software ([email or web address])

## 2020-08-26 NOTE — Progress Notes (Signed)
* * *        **Kathy Morales**    --- ---    65 Y old Female, DOB: Oct 17, 1955, External MRN: [id number]    Account Number: [id number]    [address] [name], [place]    Home: [phone]    Insurance: HMO BLUE OUT [employer]    PCP: Rella Larve Referring: Jerilee Field    Appointment Facility: [facility]        * * *    03/31/2016  Progress Notes: [name], MD **CHN#:** [phone]    --- ---    ---        Reason for Appointment    ---      1\. L4-5 stenosis    ---    2\. Right lumbar radiculopathy    ---      History of Present Illness    ---     _GENERAL_ :    Kathy Morales is a pleasant 65 year old woman I am seeing today for the first time.  She was referred to me by Dr. Jonathon Bellows for right lumbar radiculopathy. She has  had right leg pain for many years but it has recently flared up. She underwent  a course of PT which did not help. Dr. Jonathon Bellows did an epidural injection which  gave her excellent pain relief for about 24 hours before the pain returned,  which was attributed to the effect of the marcaine. She has some mild pain in  the left buttock, but most of the pain is in the right buttock, posterior  thigh and posterior calf. It occasionally goes into the bottom of the right  foot. It is worse with any activity and by the end of the day she rates it a  10/10 in severity. When she is sitting, it is tolerable. She cannot stand or  walk for long periods of time and the leg pain prevents her from gardening and  cooking. She does home stretching which does help a little. She denies any  fever/chills, no changes in bowel/bladder habits.      Current Medications    ---    Taking     * Albuterol Sulfate 108 (90 Base) MCG/ACT Aerosol Powder Breath Activated 2 puffs as needed Inhalation every 6 hrs    ---    * Ativan 1 MG Tablet 1 tablet at bedtime as needed Orally Once a day    ---    * Calcium 1 tab Oral daily    ---    * Fish Oil (omega-3 fatty acids)     ---    * flovent 100MG  3 tabs twice daily    ---     * HCTZ (hydrochlorothiazide) 50 mg twice daily    ---    * Hydrocodone-Acetaminophen 5-325 MG Tablet 1-2 tablet as needed Orally every 6 hrs    ---    * Metformin 500 mg 1 tab Oral twice daily    ---    * Pantoprazole Sodium 40 MG Tablet Delayed Release 1 tablet Orally twice daily    ---    * Provigil 100 mg Tablet 3 tablet in the morning Orally Once a day    ---    * Simvastatin 80 MG Tablet 1 tablet in the evening Orally Once a day    ---    * Vitamin D once a day    ---    * Medication List reviewed and reconciled with the patient    ---  Past Medical History    ---       High cholesterol.        ---    Hypertention.        ---    NIDDM.        ---    GERD.        ---    Asthma.        ---      Surgical History    ---      mastectomy for breast CA 2004    ---      Family History    ---      Mother: alive, diagnosed with Diabetes, Heart Disease, Hypertension    ---    Father: alive, diagnosed with Diabetes, Hypertension, Heart Disease    ---    Non-Contributory    ---      Social History    ---    Tobacco  history: Never smoked.    Work/Occupation: employed full-time, Engineer, site. [employer]    Alcohol  Yes but rarely.   Lives at home independently, recently started using  a cane to ambulate.    ---      Allergies    ---      aspirin    ---    NSAIDs    ---      Review of Systems    ---    Per HPI.      Vital Signs    ---    Pain scale 8.      Examination    ---     _GENERAL_ :    Lumbar spine MRI from an OSH in October 2017 was reviewed and uploaded into  our system. I also reviewed the radiologist's report. There are mild  multilevel degenerative changes seen throughout. Alignment is within normal  limits. 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 At L4-5 there is a posterior disc bulge and hypertrophic  ligamentum flavum that results in severe central stenosis and moderate  foraminal stenosis. L5-S1 there is a small posterior disc bulge that does not  result  in any canal narrowing. No significant foraminal stenosis at L5-S1.          Physical Examination    ---    Patient is well-nourished, well-appearing and in no distress. Ambulates with  an antalgic gait favoring the right leg and is able to heel and toe walk with  good strength and coordination. Posture and alignment are within normal  limits. 5/5 strength in iliopsoas, quads, tibialis anterior, EHL and gastroc  muscle groups. Sensation is intact to light touch in the L3-S1 nerve  distributions bilaterally but diminished in the right L5 and S1 distributions  compared to the left. Negative straight leg raise bilaterally. Diminished  patellar and Achilles reflexes bilaterally. Extremities are warm and well-  perfused without any peripheral edema.      Assessments    ---    1\. Lumbar stenosis without neurogenic claudication - M48.061 (Primary)    ---    2\. Lumbar radiculopathy, right - M54.16    ---      Treatment    ---       **1\. Lumbar stenosis without neurogenic claudication**    Notes: All findings were reviewed with Carolyn in the office today. We  reviewed her MRI results together. Her symptoms of right sided lumbar  radiculopathy correlate well with the stenosis  seen on MRI at L4-5. She has  tried numerous conservative treatments including medications, PT and an  epidural injection which actually aggravated her leg pain. She is interested  in definitive surgical intervention and I believe she is a good candidate for  L4-5 decompression and instrumented fusion due to her congenital stenosis and  preserved disc height at L4-5. I believe that a decompression surgery alone  would have a high likelilhood of developing instability in the future because  I will have to resect a large portion of her facet joints to achieve adequade  decompression of the neural elements. I briefly described the risks and  benefits of operative intervention as well as the expected recovery time and  she wishes to proceed. We will  schedule surgery for sometime in January and I  will see her back in the office for a preop visit. All questions were answered  and she agrees with the plan.    ---      Procedures    ---    Please cc: Dr. Nelly Rout fax: [phone].      Follow Up    ---    for preop visit    Electronically signed by [name] , MD on 03/31/2016 at 04:51 PM EDT    Sign off status: Completed        * * *        [facility]    [address]    [place], [place] [address]    Tel: [phone]    Fax: [phone]              * * *          Patient: OLISA, QUESNEL DOB: Dec 27, 1955 Progress Note: [name], MD  03/31/2016    ---    Note generated by eClinicalWorks EMR/PM Software ([email or web address])

## 2020-08-26 NOTE — Progress Notes (Signed)
* * *        **Kathy Morales**    --- ---    77 Y old Female, DOB: 1956/05/31, External MRN: [id number]    Account Number: [id number]    [address] [name], [place]    Home: [phone]    Insurance: HMO BLUE OUT [employer]    PCP: Rella Larve Referring: Rella Larve    [facility]        * * *    04/21/2016  Progress Notes: [name], MD **CHN#:** [phone]    --- ---    ---        Reason for Appointment    ---      1\. L4-5 stenosis    ---    2\. Right lumbar radiculopathy    ---      History of Present Illness    ---     _GENERAL_ Kathy Morales is here today for preop visit with me and with our anesthesiologists.  She is scheduled for L4-5 TLIF on 05/06/16. She reports no change in her  symptoms since I saw her last. She is still having right leg pain in an L5  distribution as well as bilateral buttock pain when she stands or walks for  more than a few minutes. It is improved with rest. She has tried multiple  modes of conservative therapy over the years, all without lasting relief  therefore she is seeking definitive surgical management. She has had a recent  URI and was on a steroid taper for her respiratory symptoms so her blood  sugard have been more difficult to control.      Current Medications    ---    Taking     * Albuterol Sulfate 108 (90 Base) MCG/ACT Aerosol Powder Breath Activated 2 puffs as needed Inhalation every 6 hrs    ---    * Ativan 1 MG Tablet 1 tablet at bedtime as needed Orally Once a day    ---    * Calcium 1 tab Oral daily    ---    * Fish Oil (omega-3 fatty acids)     ---    * flovent 100MG  3 tabs twice daily    ---    * HCTZ (hydrochlorothiazide) 50 mg twice daily    ---    * Hydrocodone-Acetaminophen 5-325 MG Tablet 1-2 tablet as needed Orally every 6 hrs    ---    * Metformin 500 mg 1 tab Oral twice daily    ---    * Pantoprazole Sodium 40 MG Tablet Delayed Release 1 tablet Orally twice daily    ---    * Provigil 100 mg Tablet 3 tablet in  the morning Orally Once a day    ---    * Simvastatin 80 MG Tablet 1 tablet in the evening Orally Once a day    ---    * Vitamin D once a day    ---    * Medication List reviewed and reconciled with the patient    ---      Past Medical History    ---       High cholesterol.        ---    Hypertention.        ---    NIDDM.        ---    GERD.        ---    Asthma.        ---  Surgical History    ---      mastectomy for breast CA 2004    ---      Family History    ---      Mother: alive, diagnosed with Diabetes, Hypertension, Heart Disease    ---    Father: alive, diagnosed with Diabetes, Hypertension, Heart Disease    ---    Non-Contributory    ---      Social History    ---    Tobacco  history: Never smoked.    Work/Occupation: employed full-time, Engineer, site. [name]    Alcohol  Yes but rarely.   Lives at home independently, recently started using  a cane to ambulate.    ---      Allergies    ---      aspirin    ---    NSAIDs    ---      Review of Systems    ---    Per HPI.      Vital Signs    ---    Pain scale 8.      Examination    ---     _GENERAL_ :    Lumbar spine MRI from an OSH in October 2017 was reviewed again. I also  reviewed the radiologist's report. There are mild multilevel degenerative  changes seen throughout. Alignment is within normal limits. No evidence of  listhesis but there does appear to be a congenitally narrow canal. At L4-5  there is a posterior disc bulge and hypertrophic ligamentum flavum that  results in severe central stenosis and moderate bilateral foraminal stenosis.  L5-S1 there is a small posterior disc bulge that does not result in any canal  narrowing. No significant foraminal stenosis at L5-S1.    Upright lumbar spine radiographs were reviewed. There is maintenance of lumbar  lordosis, no listhesis. degenerative disc disease at L5-S1 with loss of  height. Disc height at other levels is preserved. No evidence of dynamic  instability.          Physical Examination     ---    Patient is well-nourished, well-appearing and in no distress. Ambulates with  an antalgic gait favoring the right leg and is able to heel and toe walk with  good strength and coordination. Posture and alignment are within normal  limits. 5/5 strength in iliopsoas, quads, tibialis anterior, EHL and gastroc  muscle groups. Sensation is intact to light touch in the L3-S1 nerve  distributions bilaterally but diminished in the right L5 and S1 distributions  compared to the left. Negative straight leg raise bilaterally. Diminished  patellar and Achilles reflexes bilaterally. Extremities are warm and well-  perfused without any peripheral edema.      Assessments    ---    1\. Lumbar radiculopathy, right - M54.16    ---    2\. 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 Others**    Notes: All findings were reviewed with Mackenzey in the office today. Her  symptoms of right sided lumbar radiculopathy correlate well with the stenosis  seen on MRI at L4-5. I believe she does have an element of neurogenic  claudication with some bilateral buttock pain as well. She has tried numerous  conservative treatments including medications, PT and an epidural injection  all without lasting symptom relief. I believe she  is a good candidate for L4-5  decompression and instrumented fusion due to her congenital stenosis and  preserved disc height at L4-5. I believe that a decompression surgery alone  would have a high likelilhood of developing instability in the future because  I will have to resect a large portion of her facet joints to achieve adequade  decompression of the neural elements. The procedure, risks and benefits were  discussed with the patient in detail. Risks include, but are not limited to,  bleeding, infection, nerve injury, paralysis, permanent numbness or weakness,  persistent back/leg pain, CSF leak/dural tear, hardware failure or  malpositioning,  nonunion, need for further surgery, and medical/anesthesia  complications. All of these risks were discussed in layman's terms. She has a  good understanding and reasonable expectations with regard to expected  surgical outcomes, goals of surgery, and recovery time. All questions were  answered. Informed consent was signed in the office today. Of note, she is  going to try and recover in [place] after her two week postop visit due to some  social reasons. Her husband will be traveling with her and helping to take  care of her. I will call her with the results of her hemoglobin A1c if it is  over 8.0 and we will unfortunately have to postpone her surgery if that is the  case. She understands and agrees with the plan.    ---      Procedures    ---    Please cc: Dr. Nelly Rout fax: [phone].      Follow Up    ---    for preop visit    Electronically signed by [name] , MD on 04/21/2016 at 02:40 PM EST    Sign off status: Completed        * * *        [facility]    [address]    [place], [place] [address]    Tel: [phone]    Fax: [phone]              * * *          Patient: Kathy Morales, Kathy Morales DOB: 1955/09/09 Progress Note: [name], MD  04/21/2016    ---    Note generated by eClinicalWorks EMR/PM Software ([email or web address])

## 2020-08-26 NOTE — Progress Notes (Signed)
* * *        [name]**    --- ---    65 Y old Female, DOB: 05-Aug-1955, External MRN: [id number]    Account Number: [id number]    [address] [name], [place]    Home: [phone]    Insurance: HMO BLUE OUT [employer]    PCP: Rella Larve Referring: Rella Larve    Appointment Facility: Pulmonology        * * *    05/03/2017   **Appointment Provider:** Shanna Cisco, MD **CHN#:** [phone]    --- ---      **Supervising Provider:** AMY Minus Breeding, MD    ---        Reason for Appointment    ---      1\. Follow-up: asthma    ---      History of Present Illness    ---     _GENERAL_ :    65 year old female with severe, persistent asthma on high dose ICS and LABA  therapy last seen in clinic in 01/2017 by Dr. Candiss Norse.    Since her last clinic visit, she feels better, although, she continues to have  a cough, which is mildly bothersome to her. She notes that her symptoms are  worse at work and at nighttime. She denies exposure to dust, perfumes, mold  exposure at work. She did use azithromycin thrice weekly for a month, and  noted some mild improvement. She has not required prednisone or antibiotics,  and did get the influenza vaccine this year. She uses albuterol PRN at  nighttime twice a week, and is otherwise compliant with her ICS/LABA inhaler.  She denies oral thrush or painful swallowing. She has mild-moderate OSA and  has an appointment to start CPAP therapy. Her last meal before bedtime is  late, and she goes to sleep straight after. She also complains of frequent  throat clearing, and uses Flonase as needed. She has lost weight since her  last visit.    PFTs: FEV1 2.09 (LLN 1.8) FVC 3.02 (LLN 2.5) F/F 69 No BD response TLC 5.39  (LLN 3.8) DLCO 20.0 (92% pred).      Current Medications    ---    Taking     * Advair HFA 230-21 MCG/ACT Aerosol 2 puffs Inhalation Twice a day    ---    * Albuterol Sulfate 108 (90 Base) MCG/ACT Aerosol Powder Breath Activated 2 puffs as needed Inhalation every 6 hrs    ---    *  Ativan 1 MG Tablet 1 tablet at bedtime as needed Orally Once a day, Notes: takes nightly    ---    * Calcium 1 tab Oral daily    ---    * Fish Oil (omega-3 fatty acids)     ---    * Flonase 50 MCG/ACT Suspension 1 spray in each nostril Nasally Once a day    ---    * HCTZ (hydrochlorothiazide) 50 mg twice daily    ---    * Metformin 500 mg 1 tab Oral twice daily    ---    * Pantoprazole Sodium 40 MG Tablet Delayed Release 1 tablet Orally twice daily    ---    * RANITIDINE 150 MG TABLET     ---    * Simvastatin 80 MG Tablet 1 tablet in the evening Orally Once a day    ---    * Singulair 10 mg Tablet 1 tablet Orally Once  a day    ---    * Vitamin D once a day    ---    Not-Taking/PRN    * Azithromycin 500 mg Tablet 1 tab Orally Every MWF    ---    * flovent 100MG  3 tabs twice daily    ---    * Provigil 100 mg Tablet 3 tablet in the morning Orally Once a day    ---    * Medication List reviewed and reconciled with the patient    ---      Past Medical History    ---       Hypercholesterolemia.        ---    Hypertention.        ---    NIDDM.        ---    GERD.        ---    Asthma.        ---    Breast cancer.        ---      Surgical History    ---      Mastectomy for breast CA 2004    ---    L4-L5 TLIF [facility] Dr. Charlton Amor 05/06/16    ---      Family History    ---      Mother: alive, diagnosed with Diabetes, Hypertension, Heart Disease    ---    Father: alive, diagnosed with Diabetes, Hypertension, Heart Disease    ---    Per pt no FH of asthma (save for husband).    ---      Social History    ---    Tobacco    history: _Never smoked_    Work/Occupation: employed full-time, Engineer, site. [name]    Alcohol    _Yes but rarely_   Lives at home independently with her husband. No longer  using a cane to ambulate. Never smoker. Rare etoh use. No illicits. No  exposure to mold. No pets (formerly had a cat). No change in housing recently,  most recent travel to [place] in 07/2016.    ---      Allergies    ---      aspirin     ---    NSAIDs    ---      Review of Systems    ---     _ADULT Pulmonary_ :    Constitutional no fever, chills, night sweats. Eyes no vision changes. HENT no  nasal congestion, runny nose. Cardiovascular no chest pain, palpitations.  Genitourinary no difficulty urinating. Musculoskeletal no joint pain.  Neurological no dizziness. Psychological no depression, anxiety. Skin no rash.          Vital Signs    ---    Pain scale 0, Ht-in 63.6, BP 122/88, HR 96, RR 12, Temp 97.7, Oxygen sat %  99RA.      Examination    ---     _GENERAL_ :    General Well-appearing, no distress .    Ears, Nose, Mouth and Throat Posterior nasopharyngeal erythema, inferior  turbinate hypertrophy and erythema .    Respiratory Good air movement, clear to ausculation, no wheeze or rhonci, no  accessory muscle use .    Cardiovascular RRR, nl S1 nd S2, no murmur, no peripheral edema .    Lymph nodes No cervical lymphadenopathy .    Skin No telangectasias, tattoos, or rashes, no nodules or nail changes .  Assessments    ---    1\. Asthma - J45.909 (Primary)    ---    2\. Lung nodule < 6cm on CT - R91.1    ---      [name] is a 65 year old female with history of severe, persistent asthma,  GERD and upper airway cough syndrome. Since her last clinic visit, her cough  is much better, although not completely resolved. I believe that her cough is  likely multifactorial secondary to GERD and UACS, and not entirely explained  by asthma (to the extent that it is not clear that she has severe disease).    Plan:    -We discussed diet and lifestyle modifications to help control GERD, including changing the time of her last meal to 2 hours before bedtime, eating small, frequent meals and avoidance of red wine, red meat, caffeinated beverages.    -Refer to Dr. Revonda Standard (GI) for evaluation of GERD. Does she need surgical treatment with a Nissen fundoplication?    -I advised the use of nasal saline along with Flonase to prevent drying of the nares and  epistaxis. Nasal saline should be used up to five times a day.    -Try a non-sedating histamine without a decongestant - cetirzine, Allegra at bedtime.    -We provided teaching on how to use a peak flow meter. Her peak flow in clinic was . We have asked that she measure a peak flow at work and at home and while on vacation, and record results to bring to her next clinic visit.    -Continue Advair and singulair. The need of high dose Advair should be re-assessed at each visit.    -Ongoing weight loss encouraged - this will also help to improve respiratory symptoms.    -We advised against the use of lorazepam since she has untreated OSA.    -We will refer her to PCP for management of hyperlipidemia - simvastatin 80mg  has a FDA warning against its use. This should be changed.    -We advised that nodules < 6mm in a non-smoker do not require follow-up. Metastatic disease with one small nodule is extremely unlikely. We advise against follow-up imaging.    -We will see her again in 8 months or sooner if clinically indicated.    ---      Treatment    ---       **1\. Asthma**    Start Singulair Tablet, 10 MG, 1 tablet, Orally, Once a day, 90 day(s), 90,  Refills 4    Start Advair HFA Aerosol, 230-21 MCG/ACT, 2 puffs, Inhalation, Twice a day, 90  days, 3, Refills 4    _IMAGING: CT Chest_    _PROCEDURE: PEAK FLOW METER_    Notes: Asthma in Adults: Care Instructions material was printed.    ---      Follow Up    ---    August    **Appointment Provider:** Shanna Cisco, MD    Electronically signed by Sydnee Levans , MD on 05/10/2017 at 01:53 PM EST    Sign off status: Completed        * * *        Pulmonology    [address]    [place], [place] [address]    Tel: [phone]    Fax: [phone]              * * *          Patient: KALLEY, NICHOLL DOB: June 14, 1955 Progress Note:  Shanna Cisco, MD  05/03/2017    ---    Note generated by eClinicalWorks EMR/PM Software ([email or web address])

## 2020-08-26 NOTE — Progress Notes (Signed)
* * *        **  Kathy Morales**    --- ---    30 Y old Female, DOB: 12/27/1955    [address] [place], [place] [address]    Home: [phone]    Provider: [name]        * * *    Telephone Encounter    ---    Answered by   Larinda Buttery  Date: 05/10/2016         Time: 03:25 PM    Caller   [facility]    --- ---            Reason   Medication Questions            Message                      Wadley, Nurse from [facility], called to say they are admitting Kathy Morales for OT and PT. Wadley would like a call back regarding Avenell's medications. She wants clarification as to how many times Dayanne should be taking each of her medications every day as well as how many pills she should be taking.      Questions regarding: Tizanidine, Gabapentin and Dilaudid      CB: [phone]                Action Taken   [name],[name] 05/10/2016 3:28:06 PM > Spoke with the [facility].  Explained a good pain medication for her. HEALY,BRETT , PA 05/10/2016 3:44:30  PM >                * * *                ---          * * *          Patient: Kathy Morales, Kathy Morales DOB: 23-Feb-1956 Provider: [name]  05/10/2016    ---    Note generated by eClinicalWorks EMR/PM Software ([email or web address])

## 2020-08-26 NOTE — Progress Notes (Signed)
* * *        Kathy Morales**    --- ---    17 Y old Female, DOB: 1956/03/19, External MRN: [id number]    Account Number: [id number]    [address] [name], [place]    Home: [phone]    Insurance: HMO BLUE OUT [employer]    PCP: Rella Larve Referring: Rella Larve    Appointment Facility: Pulmonology        * * *    02/01/2017  Progress Notes: Kathy Juncaj Minus Breeding, MD **CHN#:** [id number]    --- ---    ---        Reason for Appointment    ---      1\. Asthma    ---      History of Present Illness    ---     _GENERAL_ :    Kathy Morales is a pleasant 49 F presenting for follow up regarding asthma and  chest CT results. At las visit she presented with poorly controlled symptoms.  She was started on Advair 230-21 2 puffs bid, singulair, flonase, and  albuterol prn. Since then her breathing and hoarsness have improved and she is  using her rescue inhaler much less frequently. She is still bothered by her  cough which remains non-productive. No triggers or pattern to cough. At times  feels like it comes from her throat, at time from her chest. She has been on  PPI bid and recently added H2 blocker at bedtime. Cough is improved since last  visit but remains problematic for her. Her other concern today is the finding  of a 3mm solid RML nodule on her chest CT. Given her hx of breast cancer (s/p  mastectomy 2004) she is concerned about the possibility of metastatic disease  or new primary lung cancer. She denies recent fevers, chills, night sweats,  sore throat, weight loss, chest pain, flushing, rash, joint aches.    PFTs: FEV1 2.09 (LLN 1.8) FVC 3.02 (LLN 2.5) F/F 69 No BD response TLC 5.39  (LLN 5.8) DLCO 20.0 (92% pred)    CT Chest: mild airway thickening, 3 mm solid nodule in RML. No consoloditave  or emphasematous changes.      Current Medications    ---    Taking     * Advair HFA 230-21 MCG/ACT Aerosol 2 puffs Inhalation Twice a day    ---    * Albuterol Sulfate 108 (90 Base) MCG/ACT Aerosol Powder Breath  Activated 2 puffs as needed Inhalation every 6 hrs    ---    * Ativan 1 MG Tablet 1 tablet at bedtime as needed Orally Once a day    ---    * Calcium 1 tab Oral daily    ---    * Fish Oil (omega-3 fatty acids)     ---    * Flonase 50 MCG/ACT Suspension 1 spray in each nostril Nasally Once a day    ---    * HCTZ (hydrochlorothiazide) 50 mg twice daily    ---    * Metformin 500 mg 1 tab Oral twice daily    ---    * Pantoprazole Sodium 40 MG Tablet Delayed Release 1 tablet Orally twice daily    ---    * Provigil 100 mg Tablet 3 tablet in the morning Orally Once a day    ---    * Simvastatin 80 MG Tablet 1 tablet in the evening Orally Once a day    ---    *  Singulair 10 mg Tablet 1 tablet Orally Once a day    ---    * Vitamin D once a day    ---    Not-Taking/PRN    * flovent 100MG  3 tabs twice daily    ---    * Medication List reviewed and reconciled with the patient    ---      Past Medical History    ---       High cholesterol.        ---    Hypertention.        ---    NIDDM.        ---    GERD.        ---    Asthma.        ---    Breast cancer.        ---      Surgical History    ---      mastectomy for breast CA 2004    ---    L4-L5 TLIF [facility] Dr. Charlton Amor 05/06/16    ---      Social History    ---    Tobacco  history: Never smoked.    Work/Occupation: employed full-time, Engineer, site. [name]    Alcohol  Yes but rarely.   Lives at home independently with her husband. No  longer using a cane to ambulate. Never smoker. rare etoh use. No illicits. No  exposure to mold. No pets (formerly had a cat). No hcange in housing recnetly,  most recent travel to [place] in 07/2016.    ---      Allergies    ---      aspirin    ---    NSAIDs    ---      Review of Systems    ---    See HPI.      Vital Signs    ---    Pain scale 0, Ht-in 63.6, Wt-lbs 178.3, BMI 30.99, BP 120/90, HR 110, RR 16,  Temp 99.0, Oxygen sat % 97%RA.      Examination    ---     _GENERAL_ :    General Well-appearing, no distress .    Eyes Pupils equal,  Round, Full range of motion, No sclera icteris, non-  injected .    Ears, Nose, Mouth and Throat posterrior nasopharyngeal erythema, inferior  turbinate hypertrophy and erythema.    Respiratory Good air movement, clear to ausculation, no wheeze or rhonci, no  accessory muscle use .    Cardiovascular RRR, nl S1 nd S2, no murmur, , No peripheral edema .    Abdomen Soft, Nontender, nondistended.    Lymph nodes No cervical lymphadenopathy .    Musculoskeletal / Neurological Normal gait, no atrophy of the lower or upper  extremities, full range of motion of lower and upper extremities,.    Skin No telangectasias, tatoos, or rashes, no nodules, no rashes or nail  changes .          Assessments    ---    1\. Asthma - J45.909 (Primary)    ---    2\. Pulmonary nodule less than 6 cm determined by computed tomography of lung  - R91.1    ---    3\. Cough - R05    ---      Kathy Morales is a pleasant 66 F presenting for foolow up for asthma and CT  chest. PFTs confirmed mild obstuctive  ventilatory defect, otherwise normal.  Her symptoms are much improved on ICS/LABA and montelukast. She is using her  prn SABA less frequently. Her cough remains bothersome however we discussed  that she likely needs mor time on current therapies (inhalers, flonase, PPI)  before we would persue further diagnositc testing such as laryngoscopy or  bronchoscopy.We did discuss possible benefit from addition of azithromycin MWF  for antiinflammatory effects and was agreeable to this. CBC checked today, no  evidence of eosinophilia. Regarding the 3 mm nodule on chest CT, Kathy Morales was  counseled regarding low risk features and absence of evidence to support  repeat imaging. however, given her history of breast cancer and significant  anxiety about possibility of missing another malignancy, we will obtain repeat  CT scan in 6 months.    - Continue Advair 230-21 2 puffs bid    - continue singulair daily    - continue albuterol prn    - continue flonase     - continue PPI, H2 blocker    - start Azithromycin 500 mg MWF x8 weeks    - repeat CT chest in 6 months    - f/u 6-8 weeks.    ---      Treatment    ---       **1\. Asthma**    Continue Albuterol Sulfate Aerosol Powder Breath Activated, 108 (90 Base)  MCG/ACT, 2 puffs as needed, Inhalation, every 6 hrs    Continue Advair HFA Aerosol, 230-21 MCG/ACT, 2 puffs, Inhalation, Twice a day    Continue Singulair Tablet, 10 mg, 1 tablet, Orally, Once a day    Continue Flonase Suspension, 50 MCG/ACT, 1 spray in each nostril, Nasally,  Once a day    Start Azithromycin Tablet, 500 mg, 1 tab, Orally, Every MWF, 8 weeks, 24,  Refills 0    _LAB: Immunoglobulin E IgE (IGE)_    _LAB: CBC/DIFF with PLT (CBCWD)_   WBC  5.7    4.0 - 11.0 - K/uL    --- --- --- ---    RBC  4.79    3.70 - 5.00 - M/uL    --- --- --- ---    HGB  14.2    11.0 - 15.0 - g/dL    --- --- --- ---    HCT  41.0    32.0 - 45.0 - %    --- --- --- ---    MCV  85.6    80.0 - 98.0 - fL    --- --- --- ---    MCH  29.6    26.0 - 34.0 - pg    --- --- --- ---    MCHC  34.6    32.0 - 36.0 - g/dL    --- --- --- ---    RDW  12.6    11.5 - 14.5 - %    --- --- --- ---    PLT  229    150 - 400 - K/uL    --- --- --- ---    MPV  10.1    9.1 - 11.7 - fL    --- --- --- ---    SEG NEUT  44     \- %    --- --- --- ---    LYMPH  45     \- %    --- --- --- ---    MONO  9     \- %    --- --- --- ---  EOS  1     \- %    --- --- --- ---    BASO  1     \- %    --- --- --- ---    NEUT #  2.5    1.5 - 7.5 - K/uL    --- --- --- ---    LYMPH #  2.6    1.0 - 4.0 - K/uL    --- --- --- ---    MONO #  0.5    0.2 - 0.8 - K/uL    --- --- --- ---    EOSIN #  0.1    0.0 - 0.5 - K/uL    --- --- --- ---    BASO #  0.0    0.0 - 0.2 - K/uL    --- --- --- ---    Imm Grnas  0     \- %    --- --- --- ---    Imm Grans, Abs  0.0    0.0 - 0.1 - K/uL    --- --- --- ---        Ardine Bjork: CT Chest (Ordered for 02/01/2017)_       Follow Up    ---    6 Weeks    Electronically signed by Sydnee Levans , MD on 02/08/2017  at 04:40 PM EDT    Sign off status: Completed        * * *        Pulmonology    [address]    [place], [place] [address]    Tel: [phone]    Fax: [phone]              * * *          Patient: Kathy Morales, Kathy Morales DOB: [birth year] Progress Note: Kathy Greenslade Minus Breeding, MD  02/01/2017    ---    Note generated by eClinicalWorks EMR/PM Software ([email or web address])

## 2020-09-04 ENCOUNTER — Ambulatory Visit (INDEPENDENT_AMBULATORY_CARE_PROVIDER_SITE_OTHER): Admitting: Rehabilitative and Restorative Service Providers"

## 2020-11-18 ENCOUNTER — Inpatient Hospital Stay: Admit: 2020-11-18 | Payer: MEDICARE

## 2020-11-18 ENCOUNTER — Encounter (INDEPENDENT_AMBULATORY_CARE_PROVIDER_SITE_OTHER): Admitting: Orthopaedic Surgery

## 2020-11-18 ENCOUNTER — Other Ambulatory Visit

## 2020-11-18 ENCOUNTER — Ambulatory Visit: Admit: 2020-11-18 | Payer: MEDICARE | Attending: Orthopaedic Surgery

## 2020-11-18 DIAGNOSIS — M25562 Pain in left knee: Secondary | ICD-10-CM

## 2020-11-18 MED ORDER — methylPREDNISolone acetate (DEPO-Medrol) injection 80 mg
40 | Freq: Once | INTRAMUSCULAR | Status: AC | PRN
Start: 2020-11-18 — End: 2020-11-18
  Administered 2020-11-18: 20:00:00 80 mg via INTRA_ARTICULAR

## 2020-11-18 NOTE — Progress Notes (Signed)
Department of Orthopaedic Spine Surgery  PRIMARY CARE PHYSICIAN: Nelly Rout, MD   REFERRING PROVIDER: No ref. provider found, N/A     Chief Complaint: Left knee pain, meniscus tear    HPI:  Kathy Morales is a 65 y.o. female who presents to clinic for evaluation of left knee pain.  This began several months ago.  She will experience catching and clicking which is not painful.  She does experience episodes of swelling of the left knee.  She has had to scale back her exercise at the gym with her personal trainer due to knee pain.  Pain is mostly localized to the medial aspect of the left knee. No radiation farther down the leg.    History:  Past Medical History  Past Medical History:   Diagnosis Date   ? Asthma 08/09/1990    Formatting of this note might be different from the original. Overview:  ASTHMA Formatting of this note might be different from the original. ASTHMA   ? GERD (gastroesophageal reflux disease) 11/18/2020   ? Lumbar stenosis without neurogenic claudication 11/18/2020   ? S/P lumbar fusion 11/18/2020   ? Thoracic outlet syndrome 08/09/1990    Formatting of this note might be different from the original. THORACIC OUTLET SYNDROME       Past Surgical History  History reviewed. No pertinent surgical history.    Medications  Current Outpatient Medications   Medication Instructions   ? cholecalciferol, vitamin D3, 100 mcg (4,000 unit) capsule No dose, route, or frequency recorded.   ? ciprofloxacin-dexamethasone (CiproDEX) otic suspension otic (ear)   ? esomeprazole (NEXIUM) 20 mg, oral   ? gabapentin (NEURONTIN) 200 mg, oral, 3 times daily   ? hydroCHLOROthiazide (HYDRODIURIL) 25 mg, oral, 2 times daily   ? LORazepam (ATIVAN) 1 mg, oral, 2 times daily   ? meclizine (ANTIVERT) 25 mg, oral, 3 times daily   ? metFORMIN (GLUCOPHAGE) 500 mg, oral, 2 times daily   ? nitrofurantoin, macrocrystal-monohydrate, (Macrobid) 100 mg capsule 100 mg, oral, Every 12 hours   ? NovoFine Plus 32 gauge x 1/6" needle  subcutaneous   ? ofloxacin (Floxin) 0.3 % otic solution 5 drops, otic (ear), Daily RT   ? penicillin v potassium (VEETID) 500 mg, oral, 4 times daily   ? Victoza 3-Pak 1.8 mg, subcutaneous, Daily       Allergies  Allergies   Allergen Reactions   ? Aspirin      Other reaction(s): PRIOR GI BLEED   ? Nsaids (Non-Steroidal Anti-Inflammatory Drug)    ? Other      SEASONAL ALLERGIES  SEASONAL ALLERGIES         Social History  Social History     Socioeconomic History   ? Marital status: Married     Spouse name: Not on file   ? Number of children: Not on file   ? Years of education: Not on file   ? Highest education level: Not on file   Occupational History   ? Not on file   Tobacco Use   ? Smoking status: Never Smoker   ? Smokeless tobacco: Never Used   Vaping Use   ? Vaping Use: Never used   Substance and Sexual Activity   ? Alcohol use: Defer   ? Drug use: Defer   ? Sexual activity: Not on file   Other Topics Concern   ? Not on file   Social History Narrative   ? Not on file     Social Determinants  of Health     Financial Resource Strain: Not on file   Food Insecurity: Not on file   Transportation Needs: Not on file   Physical Activity: Not on file   Stress: Not on file   Social Connections: Not on file   Intimate Partner Violence: Not on file   Housing Stability: Not on file       Review of systems: per HPI, otherwise non-contributory.      Physical Exam:    Patient is healthy well-appearing and in no apparent distress.  She ambulates with a normal gait.  Examination of the left knee reveals the skin to be intact.  Minimal effusion.  Mild tenderness to palpation over the medial joint line.  Full painless range of motion.  No crepitus.  No gross sensory or motor deficits throughout the bilateral lower extremities.  Extremities are warm and well perfused with no evidence of peripheral edema.      Imaging:    Unable to review MRI, per report there is a tear of the posterior horn of the medial  meniscus.      Assessment/Plan:  All findings were reviewed with the patient in the office today. Symptoms are consistent with medial sided left knee pain.  We reviewed treatment options.  She is interested in a steroid injection today to hopefully help with some of her pain as she prepares for a few international trips this summer.  Would like her to follow-up with one of our sports medicine specialist and she will bring her new MRI disc to that visit.    All questions were answered the their satifaction and they agree with the plan. Patient understands to call the office with any new questions or concerns.       Kathy Asa, MD, FAAOS  she/her    Orthopaedic Spine Surgeon  Phone [phone]  Fax [phone]    [facility]   [address]  [place], [place] [address]    L Inj/Asp: L knee on 11/18/2020 3:30 PM  Indications: pain  Details: 22 G needle, anterolateral approach  Medications: 80 mg methylPREDNISolone acetate 40 mg/mL  Outcome: tolerated well, no immediate complications    After verbal consent the inferior lateral aspect of the left knee was prepped with betadine. Then a mixture of 3cc of lidocaine and 2cc of depo-medrol was injected intra-articularly. Intra articular needle position was confirmed by aspiration of synovial fluid followed by free flow of the injectant. The patient tolerated the procedure well. Advised to limit their activities, apply ice and take NSAIDs over the next 24-48 hours. Cautioned to monitor for signs of infection.   Procedure, treatment alternatives, risks and benefits explained, specific risks discussed. Consent was given by the patient. Patient was prepped and draped in the usual sterile fashion.

## 2020-12-15 ENCOUNTER — Telehealth (HOSPITAL_BASED_OUTPATIENT_CLINIC_OR_DEPARTMENT_OTHER): Admitting: Orthopaedic Surgery

## 2020-12-15 NOTE — Telephone Encounter (Signed)
 Patient had emailed me about appt with a doctor in [place]? Didn't see a schedule open for him. Per Charlton Amor needs appt for LT meniscus tear.  Scheduled patient with Dr. Manson Passey next available 7/27. If patient calls back and wishes to reschedule please assist, I don't believe she'll be requesting anything as far as her spine but let me know if that is the case. Thanks!

## 2020-12-24 ENCOUNTER — Ambulatory Visit (HOSPITAL_BASED_OUTPATIENT_CLINIC_OR_DEPARTMENT_OTHER): Admitting: Orthopaedic Surgery

## 2021-01-12 ENCOUNTER — Other Ambulatory Visit

## 2021-01-19 ENCOUNTER — Inpatient Hospital Stay: Admit: 2021-01-19 | Payer: MEDICARE

## 2021-01-19 ENCOUNTER — Ambulatory Visit: Admit: 2021-01-19 | Discharge: 2021-01-19 | Payer: MEDICARE | Attending: Orthopaedic Surgery

## 2021-01-19 ENCOUNTER — Encounter

## 2021-01-19 ENCOUNTER — Other Ambulatory Visit

## 2021-01-19 ENCOUNTER — Encounter (HOSPITAL_BASED_OUTPATIENT_CLINIC_OR_DEPARTMENT_OTHER): Admitting: Orthopaedic Surgery

## 2021-01-19 DIAGNOSIS — M25562 Pain in left knee: Secondary | ICD-10-CM

## 2021-01-19 DIAGNOSIS — M25512 Pain in left shoulder: Secondary | ICD-10-CM

## 2021-01-19 NOTE — Progress Notes (Signed)
 PRIMARY CARE PHYSICIAN: Nelly Rout, MD   REFERRING PROVIDER: No ref. provider found, N/A     CHIEF COMPLAINT: 1.  Left knee pain, 2. Left shoulder pain, 3. Right elbow pain    HPI:   Kathy Morales is a 65 y.o. female here for evaluation and management of left knee pain. She describes several  months of knee pain. She reports that the pain started after a hyperflexion injury. The pain is localized to the anterior, medial and posterior knee. With occasional associated swelling. Denies any further radiating pain, numbness, or tingling. She Denies catching or locking. Relieving factors include activity modification and home exercise program. She had an intraarticular cortisone injection which provided some relief, but her pain is returning.    She also has been experiencing left shoulder pain for 6 weeks.  She was away in [place] when she had a fall down the stairs.  Since that time, she had significant pain along the anterior and lateral aspect of her left shoulder.  Her past orthopedic history is significant for prior left shoulder surgery with biceps tenodesis in 2004.  She does not recall if she had any repair of the rotator cuff.   She is experiencing weakness.  She has tried to modify her activities without relief of her pain.  She is unable to take NSAIDs due to GI bleeding.    The patient also reports right elbow pain.  She is a right-hand-dominant female.  This has been present now for close to 1 year.  She denies any injury at the onset.  Most of her discomfort is localized to the proximal lateral arm.  She has difficulty only with using the right arm such as lifting a gallon of milk.  She has been working with a Systems analyst without relief of her pain.  She is tried a tennis elbow strap without relief in her pain.  Denies any numbness, tingling or weakness throughout the right upper extremity.    Past Medical History:   Diagnosis Date   . Asthma 08/09/1990    Formatting of this note might be different  from the original. Overview:  ASTHMA Formatting of this note might be different from the original. ASTHMA   . GERD (gastroesophageal reflux disease) 11/18/2020   . Lumbar stenosis without neurogenic claudication 11/18/2020   . S/P lumbar fusion 11/18/2020   . Thoracic outlet syndrome 08/09/1990    Formatting of this note might be different from the original. THORACIC OUTLET SYNDROME     History reviewed. No pertinent surgical history.  Family History   Family history unknown: Yes     Social History     Socioeconomic History   . Marital status: Married     Spouse name: Not on file   . Number of children: Not on file   . Years of education: Not on file   . Highest education level: Not on file   Occupational History   . Not on file   Tobacco Use   . Smoking status: Never Smoker   . Smokeless tobacco: Never Used   Vaping Use   . Vaping Use: Never used   Substance and Sexual Activity   . Alcohol use: Defer   . Drug use: Defer   . Sexual activity: Not on file   Other Topics Concern   . Not on file   Social History Narrative   . Not on file     Social Determinants of Health     Financial Resource  Strain: Not on file   Food Insecurity: Not on file   Transportation Needs: Not on file   Physical Activity: Not on file   Stress: Not on file   Social Connections: Not on file   Intimate Partner Violence: Not on file   Housing Stability: Not on file     ALLERGIES: Aspirin, Nsaids (non-steroidal anti-inflammatory drug), and Other    MEDICATIONS:   Prior to Admission medications    Medication Sig Start Date End Date Taking? Authorizing Provider   cholecalciferol, vitamin D3, 100 mcg (4,000 unit) capsule    Yes Historical Provider, MD   hydroCHLOROthiazide (HYDRODiuril) 25 mg tablet Take 25 mg by mouth in the morning and at bedtime. 11/15/20  Yes Historical Provider, MD   LORazepam (Ativan) 1 mg tablet Take 1 mg by mouth in the morning and at bedtime. 09/30/20  Yes Historical Provider, MD   metaproterenol (Alupent) 10 mg tablet Take 10 mg  by mouth in the morning, at noon, and at bedtime.   Yes Historical Provider, MD   metFORMIN (Glucophage) 500 mg tablet Take 500 mg by mouth in the morning and at bedtime. 11/06/20  Yes Historical Provider, MD   omega-3 (Fish Oil) 300-1,000 mg capsule Take 1,000 mg by mouth in the morning.   Yes Historical Provider, MD   simvastatin (Zocor) 80 mg tablet Take 80 mg by mouth at bedtime.   Yes Historical Provider, MD   Victoza 3-Pak 0.6 mg/0.1 mL (18 mg/3 mL) injection Inject 1.8 mg under the skin in the morning. 10/30/20  Yes Historical Provider, MD   ciprofloxacin-dexamethasone (CiproDEX) otic suspension Administer into affected ear(s). 09/05/20   Historical Provider, MD   esomeprazole (NexIUM) 20 mg DR capsule Take 20 mg by mouth.    Historical Provider, MD   gabapentin (Neurontin) 100 mg capsule Take 200 mg by mouth in the morning, at noon, and at bedtime. 08/31/20   Historical Provider, MD   meclizine (Antivert) 25 mg tablet Take 25 mg by mouth in the morning, at noon, and at bedtime. 08/31/20   Historical Provider, MD   nitrofurantoin, macrocrystal-monohydrate, (Macrobid) 100 mg capsule Take 100 mg by mouth every 12 (twelve) hours. 07/29/20   Historical Provider, MD   NovoFine Plus 32 gauge x 1/6" needle Inject under the skin. 07/16/20   Historical Provider, MD   ofloxacin (Floxin) 0.3 % otic solution Administer 5 drops into affected ear(s) in the morning.    Historical Provider, MD   penicillin v potassium (Veetid) 500 mg tablet Take 500 mg by mouth in the morning, at noon, in the evening, and at bedtime. 08/21/20   Historical Provider, MD        VITAL SIGNS: There were no vitals taken for this visit.     PHYSICAL EXAM:    On examination Kathy Morales, is a pleasant, well-appearing female, in no apparent distress.  Ambulates with a nonantalgic gait, without assistive device.      Left Knee exam:  The Left knee demonstrates near full passive and active range of motion.  There is a trace effusion.  There is no significant soft  tissue swelling.  Negative Lachman.  Positive McMurray's.  There is tenderness along the medial joint line.  No varus or valgus laxity at 0 or 30 degrees of flexion.  No calf pain.  Good hip range of motion without pain.  Good ankle range of motion without pain.  Distally the limb is neurovascularly intact.  The skin is intact.  Left Shoulder:  The left shoulder demonstrates excellent passive and active range of motion.  The contralateral shoulder demonstrates full passive and active range of motion as well.  There is no tenderness along the clavicle, AC joint.  Well healed previous surgical incisions.  There is some slight tenderness over the anterior humeral head.  There is a positive Neer and Hawkins impingement test.  Positive Jobe's. Negative drop arm test.  Negative belly press, negative lift-off, negative bear hugger.  There is 5/5 strength with resisted external and internal rotation.  Negative speed's, negative Yergason's.  Negative Spurling's.  There is normal sensation and strength in the C5 through T1 dermatomes.  Distally in the limbs are neurovascularly intact.  The hand is warm and well perfused with brisk capillary refill in a 2+ radial pulse.    Right elbow exam: There is full passive and active range of motion of the right elbow.  She has no significant tenderness over the lateral or medial epicondyle.  There is slight tenderness over the proximal lateral forearm.  No significant tenderness over the biceps tendon.  No pain with resisted supination or pronation.  Stable ligamentous exam.  Distally, the limb is neurovascular intact.    Radiographs of the left knee(s) were obtained and reviewed today. No acute fracture or dislocation. No significant degenerative changes.     Left knee MRI: Left knee MRI was available for review from an outside facility.  There is no report available.  There is evidence of a posterior horn medial meniscus tear.  There is an associated Baker's cyst.  Overall, there is  well-maintained articular cartilage.  ACL, PCL, MCL, LCL are intact.    Shoulder x-rays: Left shoulder x-rays were taken reviewed today.  They show no obvious fractures, dislocations or significant degenerative joint disease.  There is evidence of likely prior biceps tenodesis.    Right elbow x-rays: Right elbow x-rays were taken and reviewed today.  They show no obvious fractures, dislocations or significant degenerative joint disease.     Diagnosis Plan   1. Left knee pain, unspecified chronicity  XR KNEE LEFT 4+ VIEWS   2. Acute pain of left shoulder  XR SHOULDER LEFT 2+ VIEWS    MR SHOULDER LEFT WO CONTRAST   3. Pain in right elbow  XR ELBOW RIGHT 1-2 VIEWS     Impression:  #1 left knee posterior horn medial meniscus tear  #2 left shoulder rotator cuff strain versus tear  #3 right lateral epicondylitis versus radial tunnel syndrome    TREATMENT:  In regards to the patient's left knee pain and medial meniscus tear, we discussed further treatment options.  We talked about the natural history of this condition.  She has already had 1 intra-articular cortisone injection.  I offered physical therapy which she deferred.  She states she has been doing a home exercise program without significant relief of her pain.  Unfortunately she cannot take anti-inflammatory medications due to GI bleeding.  At this point, she would like to proceed with a left knee arthroscopy with partial medial meniscectomy and debridement as she feels that the pain is significantly limiting her activities.  She is aware that the goal of the surgery would be to take care of any pain arising from the medial meniscus tear and mechanical symptoms, but may or may not alleviate any pain coming from mild underlying arthritis.     We discussed all the risks, benefits, expected outcomes and alternative treatment options.  We discussed both nonoperative  and surgical treatment options.  The patient has expressed a good understanding of the risks, benefits,  expected outcomes and alternative treatments.  All other questions were answered to their satisfaction and they are in agreement with the treatment plan.    In regards to her left shoulder, we will obtain an MRI scan to evaluate the rotator cuff.  She has been having significant pain and disability after a traumatic fall 6 weeks ago.  Her past orthopedic history is significant for previous left shoulder surgery.  She will follow-up after the MRI scan for further evaluation.    In regards to her right elbow we also discussed further treatment options for possible lateral epicondylitis versus radial tunnel syndrome.  I recommended considering physical therapy.  However, she would like to hold off on that for now.  She will follow-up with me after the MRI of her left shoulder we will reevaluate her right elbow at that time to discuss further treatment options.     I encouraged the patient to call the office or return with any increased questions, problems or concerns.  All of the patient's questions were answered to their satisfaction and they are in agreement with the treatment plan.      Earline Stiner lynn Martiza Speth, MD

## 2021-01-26 ENCOUNTER — Telehealth (HOSPITAL_BASED_OUTPATIENT_CLINIC_OR_DEPARTMENT_OTHER): Admitting: Orthopaedic Surgery

## 2021-01-26 NOTE — Telephone Encounter (Signed)
 I called the patient to review her MRI shoulder.  She reports that her symptoms are overall unchanged.  She only has pain with certain movements.      An MRI of the left shoulder from [facility] on January 20, 2021 was reviewed with the patient.  Overall, it shows that there is an intact rotator cuff with partial-thickness tearing noted in the supraspinatus.  There is evidence of subacromial bursitis.  There is evidence of prior biceps tenodesis.    Impression:  #1 left shoulder partial-thickness tear rotator cuff  #2 left shoulder impingement    Plan:  I reviewed the diagnoses and treatment options with the patient.  We discussed the role for physical therapy as well as subacromial cortisone injection.  At this point, her symptoms are quite mild and she will just continue with her activity modification and occasional anti-inflammatory medications.  If her symptoms continue or progress, she will return for follow-up.     I encouraged the patient to call the office or return with any increased questions, problems or concerns.  All of the patient's questions were answered to their satisfaction and they are in agreement with the treatment plan.

## 2021-02-09 ENCOUNTER — Telehealth (HOSPITAL_BASED_OUTPATIENT_CLINIC_OR_DEPARTMENT_OTHER): Admitting: Orthopaedic Surgery

## 2021-02-09 NOTE — Telephone Encounter (Signed)
 Called patient and left voicemail asking patient to give [place] a call back to move her Post Op appt from 10/10 11am to 10/03 11am.

## 2021-02-09 NOTE — Telephone Encounter (Signed)
 Pt called to confirmed it is okay to change her 10/10 appt to 10/03. Pt can be reached at [phone] (H). Thanks.

## 2021-02-14 ENCOUNTER — Other Ambulatory Visit

## 2021-02-16 ENCOUNTER — Encounter (HOSPITAL_BASED_OUTPATIENT_CLINIC_OR_DEPARTMENT_OTHER): Admitting: Orthopaedic Surgery

## 2021-02-16 ENCOUNTER — Other Ambulatory Visit

## 2021-02-20 ENCOUNTER — Ambulatory Visit
Admission: RE | Admit: 2021-02-20 | Discharge: 2021-02-20 | Disposition: A | Discharge status: Home / Self Care | Source: Ambulatory Visit | Attending: Orthopaedic Surgery | Admitting: Orthopaedic Surgery

## 2021-02-20 ENCOUNTER — Other Ambulatory Visit

## 2021-02-20 ENCOUNTER — Encounter

## 2021-02-20 ENCOUNTER — Ambulatory Visit (HOSPITAL_BASED_OUTPATIENT_CLINIC_OR_DEPARTMENT_OTHER)

## 2021-02-20 ENCOUNTER — Encounter (HOSPITAL_BASED_OUTPATIENT_CLINIC_OR_DEPARTMENT_OTHER)

## 2021-02-20 ENCOUNTER — Encounter (HOSPITAL_BASED_OUTPATIENT_CLINIC_OR_DEPARTMENT_OTHER)
Admission: RE | Disposition: A | Discharge status: Home / Self Care | Source: Ambulatory Visit | Attending: Orthopaedic Surgery

## 2021-02-20 LAB — POCT GLUCOSE: POCT Glucose: 87 mg/dL (ref 70–139)

## 2021-02-20 SURGERY — Meniscectomy, Knee, Medial
Anesthesia: General | Site: Knee | Laterality: Left | Wound class: Class I/ Clean

## 2021-02-20 MED ORDER — lidocaine PF (Xylocaine) 20 mg/mL (2 %) injection
20 | INTRAMUSCULAR | Status: DC | PRN
Start: 2021-02-20 — End: 2021-02-20
  Administered 2021-02-20: 19:00:00 60 via INTRAVENOUS

## 2021-02-20 MED ORDER — fentaNYL (Sublimaze) injection
50 | INTRAMUSCULAR | Status: DC | PRN
Start: 2021-02-20 — End: 2021-02-20
  Administered 2021-02-20: 19:00:00 50 via INTRAVENOUS
  Administered 2021-02-20: 19:00:00 25 via INTRAVENOUS

## 2021-02-20 MED ORDER — propofol (Diprivan) injection
10 | INTRAVENOUS | Status: DC | PRN
Start: 2021-02-20 — End: 2021-02-20
  Administered 2021-02-20: 19:00:00 200 via INTRAVENOUS

## 2021-02-20 MED ORDER — bupivacaine PF (Marcaine) 0.25 % (2.5 mg/mL) injection
0.25 | INTRAMUSCULAR | Status: DC | PRN
Start: 2021-02-20 — End: 2021-02-20
  Administered 2021-02-20: 19:00:00 30

## 2021-02-20 MED ORDER — HYDROmorphone (Dilaudid) injection 0.2 mg
2 | INTRAMUSCULAR | Status: DC | PRN
Start: 2021-02-20 — End: 2021-02-20
  Administered 2021-02-20 (×3): 0.2 mg via INTRAVENOUS

## 2021-02-20 MED ORDER — sodium chloride 0.9 % flush 1 mL
Freq: Three times a day (TID) | INTRAMUSCULAR | Status: DC | PRN
Start: 2021-02-20 — End: 2021-02-20

## 2021-02-20 MED ORDER — lactated Ringer's infusion
INTRAVENOUS | Status: DC | PRN
Start: 2021-02-20 — End: 2021-02-20
  Administered 2021-02-20: 19:00:00 via INTRAVENOUS

## 2021-02-20 MED ORDER — ondansetron (Zofran) injection 4 mg
4 | Freq: Once | INTRAMUSCULAR | Status: DC
Start: 2021-02-20 — End: 2021-02-20

## 2021-02-20 MED ORDER — acetaminophen (Tylenol) 325 mg tablet
325 | ORAL_TABLET | Freq: Four times a day (QID) | ORAL | 0 refills | 8.00000 days | Status: AC | PRN
Start: 2021-02-20 — End: ?
  Filled 2021-02-20: qty 40, 5d supply, fill #0

## 2021-02-20 MED ORDER — dexAMETHasone (Decadron) injection
4 | INTRAMUSCULAR | Status: DC | PRN
Start: 2021-02-20 — End: 2021-02-20
  Administered 2021-02-20: 19:00:00 4 via INTRAVENOUS

## 2021-02-20 MED ORDER — oxyCODONE (Roxicodone) 5 mg immediate release tablet
5 | ORAL_TABLET | Freq: Four times a day (QID) | ORAL | 0 refills | 8.00000 days | Status: AC | PRN
Start: 2021-02-20 — End: ?
  Filled 2021-02-20: qty 10, 3d supply, fill #0

## 2021-02-20 MED ORDER — sodium chloride 0.9 % infusion
0.9 | INTRAVENOUS | Status: DC
Start: 2021-02-20 — End: 2021-02-20

## 2021-02-20 MED ORDER — HYDROmorphone (Dilaudid) 2 mg/mL injection  - Omnicell Override Pull
2 | INTRAMUSCULAR | Status: AC
Start: 2021-02-20 — End: ?

## 2021-02-20 MED ORDER — ondansetron (Zofran) 4 mg tablet
4 | ORAL_TABLET | Freq: Three times a day (TID) | ORAL | 0 refills | Status: AC | PRN
Start: 2021-02-20 — End: 2021-02-27
  Filled 2021-02-20: qty 20, 7d supply, fill #0

## 2021-02-20 MED ORDER — HYDROmorphone (Dilaudid) 1 mg/mL injection  - Omnicell Override Pull
1 | INTRAMUSCULAR | Status: AC
Start: 2021-02-20 — End: ?

## 2021-02-20 MED ORDER — HYDROmorphone (Dilaudid) injection
2 | INTRAMUSCULAR | Status: DC | PRN
Start: 2021-02-20 — End: 2021-02-20
  Administered 2021-02-20: 19:00:00 .4 via INTRAVENOUS

## 2021-02-20 MED ORDER — lidocaine-epinephrine (PF) (Xylocaine W/EPI) 2 %-1:200,000 injection
2 | INTRAMUSCULAR | Status: DC | PRN
Start: 2021-02-20 — End: 2021-02-20
  Administered 2021-02-20: 19:00:00 10

## 2021-02-20 MED ORDER — fentaNYL (Sublimaze) injection 25 mcg
50 | INTRAMUSCULAR | Status: DC | PRN
Start: 2021-02-20 — End: 2021-02-20

## 2021-02-20 MED ORDER — ceFAZolin (Ancef) injection
1 | INTRAMUSCULAR | Status: DC | PRN
Start: 2021-02-20 — End: 2021-02-20
  Administered 2021-02-20: 19:00:00 2 via INTRAVENOUS

## 2021-02-20 MED ORDER — ondansetron (Zofran) injection
4 | INTRAMUSCULAR | Status: DC | PRN
Start: 2021-02-20 — End: 2021-02-20
  Administered 2021-02-20: 19:00:00 4 via INTRAVENOUS

## 2021-02-20 MED FILL — HYDROMORPHONE 1 MG/ML INJECTION WRAPPER: 1 1 mg/mL | INTRAMUSCULAR | Qty: 1

## 2021-02-20 MED FILL — HYDROMORPHONE 2 MG/ML INJECTION WRAPPER: 2 2 mg/mL | INTRAMUSCULAR | Qty: 1

## 2021-02-20 SURGICAL SUPPLY — 41 items
APPLICATOR CHLORAPREP 10.5ML O (Prep) ×1
BANDAGE COBAN 6INX5YD ST LF TA (Dressing) ×1
BANDAGE ELASTIC 6X5YD LF ST (Rehab Supply) ×2
BANDAGE ESMARK 6INX12FT LF ST (Medical Supply General) ×1
BIT DRILL 4.5MM ENDOBUTTON ST (Bit) ×1
BLADE EXCALIBUR HL 4.0 COOLCUT (Surgical Supply General) ×1
BLADE SURGICAL #11 SS ST (Blade) ×1
CLOSURE STERISTRIP .5X4 (Dressing) ×1
COVER BACKTABLE 44X90 (Medical Supply General) ×1
CUFF TOURNIQUET 2PORT 24X4 YEL (Pt Monitoring Supply) ×1
CUFF TOURNIQUET 2PORT 34X4 PUR (Pt Monitoring Supply) ×1
DRAPE FANFOLD 60X77 LG (Drape) ×2
DRAPE MINI C ARM 54X85 W/ELAST (Drape) ×1
DRESSING GAUZE 4X4 12PLY 10/PK (Dressing) ×1
DRESSING XEROFORM 1X8 ST (Dressing) ×1
ELECTRODE ESU EDGE 2.75IN COAT (Endoscopy Supply) ×1
ELECTRODE RETURN DUAL PLATE W/ (Pt Monitoring Supply) ×1
GLOVE POLYISOPRENE MICRO SZ7.0 (Glove) ×2
GLOVE SURG PROTEXIS SZ7 LF PF (Surgical Supply General) ×1
MANIFOLD 4 PORT NEPTUNE2 (Surgical Supply General) ×1
NEEDLE SAFETY GLIDE 18GX1.5 (Needle And Syringe) ×1
NO LONGER USED USE ITEM 959826 (Medical Supply General) ×1
PACK KNEE ARTHROSCOPY CUSTOM (Custom Pack) ×1
PAD ABD COMBINE 5X9 ST W/WETPR (Dressing) ×2
PENCIL BUTTON SWITCH HOLSTER (Endoscopy Supply) ×1
PIN PASSING 2.4X381 DRILL TIP (Bit) ×1
PIN PASSING 2.7X15IN DRILL PT (Bit) ×1
POSITIONER DONUT HEADREST 9IN (Surgical Supply General) ×1
SLEEVE SCD COMFORT KNEE MED NS (Medical Supply General) ×1
SOLUTION LACT RING IRR 3L (Solution) ×4
SUTURE FIBERLOOP #2 W/FIBERTAG (Suture)
SUTURE FIBERLOOP #2 W/STR NEED (Suture) ×1
SUTURE FIBERWIRE #2 38IN (Suture) ×1
SUTURE MONOCRYL+ 3 0 27IN  PS2 (Suture) ×1
SUTURE PDS+ 1X54 TPB1 BLUNT (Suture) ×1
SUTURE VICRYL+ 3 0 27IN SH (Suture) ×2
SYRINGE 30CC LL 56/BX (Needle And Syringe) ×2
TUBING ARTHREX PUMP (Tubing) ×1
TUBING CONNECTING SUCTION 7X12 (Tubing) ×1
WAND SUPER TURBOVAC 90 IFS (Surgical Instrument) ×1
WIRE GUIDE NITINOL 1.2X12 (Wire) ×1

## 2021-02-20 NOTE — Addendum Note (Signed)
 Addendum  created 02/20/21 1608 by Colen Darling, MD    Clinical Note Signed

## 2021-02-20 NOTE — Significant Event (Signed)
 Patient instructed to replace dressing as it had been slipping off. Minimal pain reported

## 2021-02-20 NOTE — Op Note (Addendum)
 Pt A&OX3 and states had a medical procedure done to lower face 02/18/21 and states her face swollen and red but getting better. Denies any other open wound/rashes/ sores.  Had COVID June 2022 and was not hospitalized.   Pt fell down a flight of stairs 12/14/20 and tore left shoulder tendon and it very positionally activated.  Pt is a retired Charity fundraiser and an Pensions consultant.

## 2021-02-20 NOTE — Anesthesia Pre-Procedure Evaluation (Signed)
 Patient: Kathy Morales    Procedure Information     Date/Time: 02/20/21 1220    Procedure: Left Knee Arthroscopy, PMM (Left Knee)    Location: [facility] OR 11 / [facility] Operating Room    Surgeons: Ulyses Jarred, MD          Relevant Problems   Anesthesia   (-) History of anesthesia complications      Pulmonary   (+) Asthma      GI   (+) GERD (gastroesophageal reflux disease)   (+) Upper gastrointestinal hemorrhage      Other   (+) Thoracic outlet syndrome       Clinical information reviewed:   Tobacco  Allergies  Meds   Med Hx  Surg Hx   Fam Hx  Soc Hx         Physical Exam    Airway  Mallampati: II  TM distance: >3 FB  Mouth opening: >3 FB  Neck ROM: full     Cardiovascular - normal exam  Rhythm: regular  Rate: normal  Functional capacity: greater than or equal to 4 METS without symptoms   Dental - normal exam     Pulmonary - normal exam     Abdominal - normal exam     General   Alert                 Anesthesia Plan    ASA 2     general     (Anesthesia options were discussed with the patient and we will proceed with a general anesthetic after a thorough conversation regarding the risks and benefits of all options of anesthesia. The patient demonstrated an understanding that the risks of anesthesia can include risk of injuries to the eyes, mouth and teeth as well as more serious complications such as cardiovascular and respiratory decompensation which would require life-saving measures but may ultimately result in death or prolonged ICU stay. Positioning for the procedure was explained to the patient and the risks of nerve injury, pressure ulcers, and visual deficits. The patient was given an opportunity to ask any questions or express any concerns. The patient has elected to proceed with this anesthetic and surgery. )Airway: LMA  Monitoring: standard monitors  Postoperative Pain Control: IV/PO analgesics    Multimodal PONV prophylaxis planned  Essential imaging and labs available and reviewed    Anesthetic plan and  risks discussed with patient.         Additional Equipment Requests

## 2021-02-20 NOTE — Brief Op Note (Signed)
 Date: 02/20/2021  Location: [facility] OR    Name: Rosalie Buenaventura, DOB: 10/13/1955, MRN: [id number]    Diagnosis  Left medial meniscus tear  Post-op Diagnosis     * Acute medial meniscus tear of left knee, initial encounter [S83.242A]     * Primary osteoarthritis of left knee [M17.12]     Procedures  Left Knee Arthroscopy, PMM and debridement  29881 - PR KNEE SCOPE,MED/LAT MENISECTOMY    PR KNEE SCOPE,SHAVE ARTICULAR CART [[id number]]  PR KNEE SCOPE,MED/LAT MENISECTOMY [[id number]]  Surgeons      * Amy Guy Franco - Primary    Procedure Summary  Anesthesia: General  ASA: II  Estimated Blood Loss: Minimal  Total IV Fluids: see anesthesia log  Drains: * None in log *    Staff:   Circulator: Ernestene Kiel, RN  Relief Circulator: Barbie Haggis  Relief Scrub: Arturo Morton, RN  Scrub Person: Norville Haggard    Indications: Lynae Pederson is an 65 y.o. female who is having surgery for Left knee Medial Meniscal Tear.      Findings: see op note    Complications:  None; patient tolerated the procedure well.     Disposition: PACU - hemodynamically stable.  Condition: stable  Specimens Collected: No specimens collected during this procedure.

## 2021-02-20 NOTE — Op Note (Signed)
 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 female who is having surgery for Left knee Medial Meniscal Tear.  The patient presented to my office with a chief complaint of left knee pain and mechanical symptoms.  Subsequent physical exam and imaging was consistent with a complex tear of the posterior horn and body medial meniscus.  Treatment options were reviewed with the patient.  She ultimately failed a trial of conservative management and elected to proceed with arthroscopic surgery.  She had a good understanding the risks, benefits, expected outcomes alternative treatments.  She was aware of the risks of infection, neurovascular injury, need for further surgery, residual pain, stiffness, DVT, PE among others.  She was also aware that the goal of the surgery was to take care of the sharp pain and mechanical symptoms arising from the medial meniscus tear that any pain coming from primary osteoarthritis pain may not improve with arthroscopic surgery.    Procedure Details:  The patient was seen in the preoperative area. The risks, benefits, complications, treatment  options, non-operative alternatives, expected recovery and outcomes were discussed with the patient. The possibilities of reaction to medication, pulmonary aspiration, injury to surrounding structures, bleeding, recurrent infection, the need for additional procedures, failure to diagnose a condition, and creating a complication requiring transfusion or operation were discussed with the patient. The patient concurred with the proposed plan, giving informed consent. The site of surgery was properly noted/marked if necessary per policy. The patient has been actively warmed in preoperative area. Preoperative antibiotics have been ordered and given within 1 hours of incision. SCDs were placed on the non-operative side.     The patient was identified in the preoperative holding area. My incisions were placed on the left knee.  The patient was then taken the operating room by the anesthesia team.  Once in the OR, general anesthesia was induced.  A well-padded left thigh tourniquet was placed.  The knee was then placed in the arthroscopic leg holder.  The left leg and knee were then prepped and draped in usual sterile fashion.  A timeout procedure was performed and IV antibiotics were administered.    The surface anatomy the knee was marked. 1% lidocaine with epinephrine was injected into the medial and lateral portal sites.  Standard lateral portal was made with a 11 blade the scope was gently introduced into the knee and a diagnostic arthroscopy was performed with the findings as noted above.  There were no loose bodies in the medial or lateral gutter.  The patellofemoral joint tracks centrally with minimal chondromalacia.  There was evidence of synovitis noted throughout the knee.  The scope was advanced into the medial compartment.  There was a complex tear of the posterior horn and body of the medial meniscus.  A medial portal was established.  A probe was introduced to further understand the tear pattern.  Utilizing  combination of arthroscopic biters and a shaver, a partial medial meniscectomy was performed.  This was debrided back to stable rim.  This was confirmed with visualization and probing.  There was minimal degenerative changes noted in the medial compartment.  There was thinning and fraying of the tibial plateau articular cartilage and medial femoral condyle.    Next attention was turned to the remainder of the diagnostic scope.  The ACL was intact.  The scope was advanced laterally which revealed an intact lateral meniscus and intact lateral sided articular cartilage.  The knee was then thoroughly irrigated with arthroscopic saline solution the portals were closed with simple 3-0 nylon suture.  30 mL of quarter percent Marcaine was injected into the knee for postoperative analgesia.  A sterile dressing of 4 x 4's, ABDs, web roll and an Ace wrap was applied.  The patient, from anesthesia and taken recovery room in stable condition.    Findings:   Left knee degenerative complex tear medial meniscus    Complications:  None; patient tolerated the procedure well.     Disposition: PACU - hemodynamically stable.  Condition: stable    Kathy Morales Guy Franco, MD

## 2021-02-20 NOTE — Op Note (Signed)
 I met with pt at surgical check in.  I reviewed the flow of the surgical day and timing. Pt's husband dropped her off and plans to return when ready for discharge.  I explained the discharge process and provided pt with the number of [facility] so she could pass it along to her husband. Delay in start time, support provided.

## 2021-02-20 NOTE — Anesthesia Procedure Notes (Signed)
 Airway    Date/Time: 02/20/2021 2:34 PM    Location:  OR  Urgency:  Elective  Difficult Airway: No    Induction Technique:  Intravenous  RSI: No    Cricoid pressure: No    Staff:     Anesthesiologist:  Susa Griffins, MD    Resident/CRNA:  Alycia Patten, CRNA    Other Anesthesia Staff:  Earney Navy, RN    Performed by:  Other anesthesia staff  Indications and Patient Condition:     Indications for Airway Management:  Anesthesia    Preoxygenated: Yes      Patient Position:  Sniffing    Mask Ventilation:  Not attempted  Final Airway Details:     Final Airway Type:  LMA    LMA Size:  4    Number of Attempts at Approach:  1

## 2021-02-20 NOTE — Discharge Instructions (Addendum)
 Department of Orthopaedic Surgery      General Post-Operative Instructions  Lower Extremity Procedures    Follow-up:  Date:__10/3/22__________ Time:____11:00AM_________  X-rays:_________________________________________________________________    Your appointment will be in the [facility] Orthopaedic Department on the 7th floor of the [place] building located @ [address] (next to the [employer]).    Main Office #: [phone]    Weight Bearing Status:   __x___Full weight bearing (wean off crutches when you are comfortable to do so)   _____Full weight bearing with brace locked in extension for _____ weeks   _____Full weight bearing as tolerated; wean off crutches when therapist   determines quad function adequate and no limp is present   _____Toe-touch weight bearing (partial body weight with crutches) for _____weeks   _____Non weight bearing (no weight at all, use crutches) for _____weeks   _____CPM unit 6-8 hours per day for 6 weeks, 0-90 degrees    Wound Care:  Keep dressings on for three to five days after surgery.  You may then remove and apply clean bandages over incision daily  Some bleeding after surgery is normal/expected and do not be alarmed if a small amount soaks through the dressing  Keep dressings and incision(s) clean and dry to avoid infection; if dressing becomes soiled/wet then remove, pat incision dry with clean towel and apply a clean bandage   After initial dressing change you may shower but incision(s) must be covered with some type of protection such as plastic wrap or bag; if incision gets wet then pat dry with clean dry towel  No submersion of incision(s) in standing water such as bathes, hot tubs, pools, etc until at least 3 weeks after surgery and wounds are sealed  DO NOT remove sutures if present  DO NOT remove splints or casts if present (no dressing changes until follow up appt)    Pain Management:  You will receive a prescription for pain medication the day of  surgery; take as directed  Pain medications are generally not refilled after the initial prescription  Common side effects are nausea, drowsiness and constipation. Consider taking medication with food and use an over-the-counter laxative as needed  You may apply ice or a cooling device to the operative area for pain control. Do not keep in place for longer than 15 minutes. Do not apply directly to the skin.  If you have a nerve block, wait until sensation has returned to utilize cooling devices to prevent frostbite.  DO NOT operate a motor vehicle or heavy machinery while on narcotic medication  DO NOT take other pain medications or Acetaminophen(Tylenol) unless instructed to do so  DO NOT drink alcohol while on pain medication     Physical Thearpy (PT):  If you were given home exercises to do, begin these 24 hours after surgery  If indicated you will receive a formal PT prescription on the day of surgery  You must bring this PT prescription to your first PT session to receive treatment  If indicated you will be given a Rehabilitation Protocol specific to your surgery, this also should be brought with you to your first PT session  PT usually begins within three to five days after surgery  Stiffness and pain are common after surgery and it is critical to continue your exercises as directed despite this discomfort  If at anytime there are questions regarding or set backs with PT, please contact my office    Continuous Passive Motion (CPM):  If indicated  you will be given a prescription for a CPM machine  This machine continuously cycles the knee through a range of motion and is intended to aide healing of cartilage following certain procedures  You will be contacted one to two days after surgery to set up home delivery  The prescription will state the intended initial settings for the machine and schedule for advancement or range of motion  The CPM should be used six to eight hours per day for six weeks   It is  critical to use the CPM as instructed to achieve optimal benefit    Other Instructions and Information:  Following knee surgery never place a pillow behind your knee for extended periods of time, especially not at night while sleeping, as this can lead to a permanent flexion contracture.  A pillow or towel roll should be placed under the heel to achieve elevation of the limb and aide with regaining full extension of the knee  Use ice (as instructed for pain management), elevation and compressive wrap/stocking to aide with swelling  Avoid long periods of sitting, bedrest or travel during the first two weeks after surgery  The effects of anesthesia including drowsiness, fatigue and nausea may take hours to days  to completely resolve  DO NOT make important decisions within 24 hours of surgery   DO NOT drive until cleared by a physician    Emergency Care:  Contact Dr. Theora Gianotti office if you experience any of the following:  Fever over 101 (transient low grade fevers after surgery are common)  Redness, excessive bleeding or persistent oozing/drainage from incisions  Increasing pain, swelling or numbness  Calf pain or swelling, chest pain or shortness of breath  Excessive nausea or vomiting  Other concerns or questions  After hours emergency questions can be answered by calling the [facility] main number @ [phone] and asking for the "on-call orthopaedic physician"  If you experience a life threatening emergency then proceed immediately to the nearest emergency room    Follow-up Care:  Your follow-up appointment with Dr.Brown should already be scheduled, as noted above, if there are any questions regarding this follow up or a need to change/cancel the appointment then please call the office at [phone]  You will need to call and schedule the PT appointment at a qualified location that is convenient to you.  A list of suggested locations around greater [place] can be provided upon request from my  office.

## 2021-02-20 NOTE — Interval H&P Note (Signed)
 H&P reviewed. The patient was examined and there are no changes to the H&P.

## 2021-02-20 NOTE — H&P (View-Only) (Signed)
 DEPARTMENT OF ORTHOPAEDIC SURGERY  Orthopaedic Surgery Clinic Note       Surgery: Arthroscopic left partial medial meniscectomy  Date of Surgery: 02/20/2021    HPI:   Kathy Morales is a 65 y.o. year old female patient who is 2 weeks s/p left partial medial meniscectomy on 02/20/2021.  Patient reports her pain is greatly improved.  Patient has been ambulating without difficulty.  Patient only notes pain with deep flexion of the left knee.  Patient denies any increasing swelling or drainage from her arthroscopy portals.  Patient denies any fevers or chills since her prior surgery.  During this visit, patient also reports left shoulder pain.  MRI of the left shoulder from 01/19/2021 showed a partial tear of the supraspinatus.  Patient reports that she has not attended physical therapy as was previously recommended.  Patient reports that her pain has been the same since the injury and July of this year.  Patient reports focal pain at the proximal medial aspect of the humerus.  Of note, patient had a biceps tenodesis in 2017 at [facility].  Patient denies any numbness or tingling in the left upper extremity.    Past Medical History:  Past Medical History:   Diagnosis Date   . Arthritis    . Asthma    . Breast cancer (CMS/HCC)     right   . Diabetes mellitus (CMS/HCC)     last AIC5.7   . GERD (gastroesophageal reflux disease) 11/18/2020   . Hyperlipidemia    . Hypertension    . Lumbar stenosis without neurogenic claudication 11/18/2020   . S/P lumbar fusion 11/18/2020   . Shoulder pain 11/2020    left-with partial ligament tear-  after fall   . Tachycardia        Past Surgical History:  Past Surgical History:   Procedure Laterality Date   . COSMETIC SURGERY      facial   . HYSTERECTOMY     . LUMBAR FUSION     . MASTECTOMY Right    . SHOULDER SURGERY          Social History:  Social History     Socioeconomic History   . Marital status: Married     Spouse name: Not on file   . Number of children: Not on file   . Years of education:  Not on file   . Highest education level: Not on file   Occupational History   . Not on file   Tobacco Use   . Smoking status: Never Smoker   . Smokeless tobacco: Never Used   Vaping Use   . Vaping Use: Never used   Substance and Sexual Activity   . Alcohol use: Defer   . Drug use: Defer   . Sexual activity: Not on file   Other Topics Concern   . Not on file   Social History Narrative   . Not on file     Social Determinants of Health     Financial Resource Strain: Not on file   Food Insecurity: Not on file   Transportation Needs: Not on file   Physical Activity: Not on file   Stress: Not on file   Social Connections: Not on file   Intimate Partner Violence: Not on file   Housing Stability: Not on file        Medications:  Current Outpatient Medications   Medication Instructions   . acetaminophen (Tylenol) 325 mg tablet Take 2 tablets (650 mg) by mouth  every 6 (six) hours if needed for pain.   . cholecalciferol, vitamin D3, 100 mcg (4,000 unit) capsule No dose, route, or frequency recorded.   . ciprofloxacin-dexamethasone (CiproDEX) otic suspension otic (ear)   . esomeprazole (NEXIUM) 20 mg, oral   . gabapentin (NEURONTIN) 200 mg, oral, 3 times daily   . hydroCHLOROthiazide (HYDRODIURIL) 50 mg, oral, Daily   . LORazepam (ATIVAN) 1 mg, oral, 2 times daily   . meclizine (ANTIVERT) 25 mg, oral, 3 times daily   . metaproterenol (ALUPENT) 10 mg, oral, 3 times daily   . metFORMIN (GLUCOPHAGE) 500 mg, oral, 2 times daily   . nitrofurantoin, macrocrystal-monohydrate, (Macrobid) 100 mg capsule 100 mg, oral, Every 12 hours   . NovoFine Plus 32 gauge x 1/6" needle subcutaneous   . ofloxacin (Floxin) 0.3 % otic solution 5 drops, otic (ear), Daily RT   . omega-3 (Fish Oil) 300-1,000 mg capsule 1,000 mg, oral, Daily   . oxyCODONE (Roxicodone) 5 mg immediate release tablet Take 1 tablet (5 mg) by mouth every 6 (six) hours if needed for pain   . penicillin v potassium (VEETID) 500 mg, oral, 4 times daily   . phentermine 15 mg, oral,  Daily before breakfast   . simvastatin (ZOCOR) 80 mg, oral, Nightly   . Victoza 3-Pak 1.8 mg, subcutaneous, Daily        Allergies:  Allergies   Allergen Reactions   . Aspirin      Other reaction(s): PRIOR GI BLEED   . Nsaids (Non-Steroidal Anti-Inflammatory Drug)    . Other      SEASONAL ALLERGIES  SEASONAL ALLERGIES          Examination:  Gen: NAD, resting comfortably    On examination Tabia is a pleasant-well appearing woman in no apparent distress.     Gait is non-antalgic.   Examination of the left knee reveals incisions are clean and intact.   No effusion, erythema, ecchymosis, or bony deformity.  There is no tenderness over the medial or lateral joint line.   ROM 0-130.   Mild pain with deep flexion.   No apprehension or excessive translation with patella manipulation.    Quad and patellar tendon are nontender without palpable gap.    Able to straight leg raise without extensor lag.    Knee is stable to varus and valgus stress test.   Stable Lachman's.   Negative posterior drawer.    Calf is soft and nontender.    Fires EHL/FHL/GS/TA.   Sensation intact to light touch in S/S/DP/SP/T nerve distributions.   Lower extremities are warm and well perfused with palpable DP and PT pulses.       Examination of the left shoulder reveals skin is clean and intact.   There is no erythema, edema, ecchymosis, or bony deformity.   There is mild tenderness over the bicepital groove,   No tenderness on AC joint, lateral cuff or posterior joint line.   AROM: Forward elevation  180, ER  90, IR  70.   PROM: Supine PROM forward elevation 180, ER  90, IR  70.   No crepitus.   Strength 5/5 supraspinatus, infraspinatus, and subscapularis.   Hawkins test: internal rotation of the arm with shoulder flexed and elbow flexed to 90 degrees produces no pain.   Speed test: supination of the forearm with the shoulder flexed and elbow extended produces mild pain.   Yergason's test: resisted internal rotation of the arm with traction applied  at elbow and arm produces  biceps tendon subluxation produces mild pain. [name]   O'Brien's test: resisted forward flexion of the arm with elbow in full extension with thumb pointed downward produces no pain.  Patient has mild proximal medial humerus pain with resisted external rotation  Sensation intact in median, ulnar, radial, and axillary nerve distributions.   Able to move all fingers and both thumbs without issue.  Hands are warm and well perfused with palpable radial pulses.    Imaging:   X-rays of left knee were obtained and reviewed today and demonstrate no acute fracture or dislocation.  Mild osteoarthritis of the knees noticed with subchondral sclerosis in the medial compartment    Assessment/Plan:   Dior Dominik is a 65 y.o. year old female patient who is s/p left partial medial meniscectomy on 02/20/2021.  Patient also presents today with complaints of left shoulder pain which has been present since July of this year.  MRI of the left shoulder showed partial tear of the supraspinatus.  Patient has not attended physical therapy as was previously recommended.  At this visit, we discussed the importance of pursuing conservative management of this tear with physical therapy.  We discussed that if physical therapy fails then we can discuss other treatment modalities.  We gave the patient a prescription for physical therapy at this visit as well.  Patient agreed that she would attend physical therapy to improve her left shoulder symptoms.  In regards to her left knee, her incisions are healing well and we recommend that the patient begin moderate range of motion and exercises for her knee.  Her sutures were removed at this visit.  In regards to the patient's left shoulder, patient will attend physical therapy and follow-up with [place] in 6 weeks.  Patient understood and agreed with the above plan.  Patient was seen and evaluated with Dr. Manson Passey.  All questions were answered.      Meribeth Mattes, MD PGY 2

## 2021-02-20 NOTE — Anesthesia Post-Procedure Evaluation (Addendum)
 Patient: Margart Zemanek    Procedure Summary     Date: 02/20/21 Room / Location: [facility] OR 11 / [facility] Operating Room    Anesthesia Start: 1425 Anesthesia Stop: 1542    Procedure: Left Knee Arthroscopy, PMM and debridement (Left Knee) Diagnosis:       Acute medial meniscus tear of left knee, initial encounter      Primary osteoarthritis of left knee      (Left knee Medial Meniscal Tear)    Surgeons: Amy Guy Franco, MD Responsible Provider: Susa Griffins, MD    Anesthesia Type: general ASA Status: 2          Anesthesia Type: general    Vitals Value Taken Time   BP 116/81 02/20/21 1537   Temp 36.7 C (98.1 F) 02/20/21 1535   Pulse 93 02/20/21 1541   Resp 12 02/20/21 1541   SpO2 98 % 02/20/21 1541   Vitals shown include unvalidated device data.    Anesthesia Post Evaluation Note:    Patient location during evaluation: PACU  Patient participation: able to participate  Level of consciousness: awake  Cardiovascular and Hydration status: vital signs within acceptable range  Respiratory Status Stable and Airway Patent: yes  Nausea and Vomiting Control Satisfactory: yes  Pain score: 0  Pain management: adequate     Vitals reviewed: yes  Unplanned ICU Admission: noPatient has recovered from anesthesia and has returned to baseline mental status, cardiovascular and respiratory function. Pain, nausea, and vomiting are adequately controlled and the patient is adequately hydrated and appropriate for discharge from PACU?: yes      There were no known complications for this encounter.

## 2021-02-21 ENCOUNTER — Encounter (INDEPENDENT_AMBULATORY_CARE_PROVIDER_SITE_OTHER)

## 2021-02-23 ENCOUNTER — Telehealth (HOSPITAL_BASED_OUTPATIENT_CLINIC_OR_DEPARTMENT_OTHER)

## 2021-02-23 NOTE — Telephone Encounter (Signed)
 Called patient to check in after she had surgery with Dr. Manson Passey 02/20/21. Patient did not answer, so I left voicemail with my contact information requesting call back.

## 2021-02-23 NOTE — Telephone Encounter (Signed)
 Patient called back. She stated "Everything is going very well, no questions or concerns at all". Else has my number should any questions/concerns arise.

## 2021-03-01 ENCOUNTER — Other Ambulatory Visit

## 2021-03-02 ENCOUNTER — Encounter

## 2021-03-02 ENCOUNTER — Ambulatory Visit: Admit: 2021-03-02 | Discharge: 2021-03-02 | Payer: BLUE CROSS/BLUE SHIELD | Attending: Orthopaedic Surgery

## 2021-03-02 ENCOUNTER — Inpatient Hospital Stay: Admit: 2021-03-02 | Payer: MEDICARE

## 2021-03-02 DIAGNOSIS — M25562 Pain in left knee: Secondary | ICD-10-CM

## 2021-03-02 NOTE — Progress Notes (Addendum)
 DEPARTMENT OF ORTHOPAEDIC SURGERY  Orthopaedic Surgery Clinic Note       Surgery: Arthroscopic left partial medial meniscectomy  Date of Surgery: 02/20/2021    HPI:   Kathy Morales is a 65 y.o. year old female patient who is 2 weeks s/p left partial medial meniscectomy on 02/20/2021.  Patient reports her pain is greatly improved.  Patient has been ambulating without difficulty.  Patient only notes pain with deep flexion of the left knee.  Patient denies any increasing swelling or drainage from her arthroscopy portals.  Patient denies any fevers or chills since her prior surgery.  During this visit, patient also reports left shoulder pain.  MRI of the left shoulder from 01/19/2021 showed a partial tear of the supraspinatus.  Patient reports that she has not attended physical therapy as was previously recommended.  Patient reports that her pain has been the same since the injury and July of this year.  Patient reports focal pain at the proximal medial aspect of the humerus.  Of note, patient had a biceps tenodesis in 2017 at [facility].  Patient denies any numbness or tingling in the left upper extremity.    Past Medical History:  Past Medical History:   Diagnosis Date   . Arthritis    . Asthma    . Breast cancer (CMS/HCC)     right   . Diabetes mellitus (CMS/HCC)     last AIC5.7   . GERD (gastroesophageal reflux disease) 11/18/2020   . Hyperlipidemia    . Hypertension    . Lumbar stenosis without neurogenic claudication 11/18/2020   . S/P lumbar fusion 11/18/2020   . Shoulder pain 11/2020    left-with partial ligament tear-  after fall   . Tachycardia        Past Surgical History:  Past Surgical History:   Procedure Laterality Date   . COSMETIC SURGERY      facial   . HYSTERECTOMY     . LUMBAR FUSION     . MASTECTOMY Right    . SHOULDER SURGERY          Social History:  Social History     Socioeconomic History   . Marital status: Married     Spouse name: Not on file   . Number of children: Not on file   . Years of education:  Not on file   . Highest education level: Not on file   Occupational History   . Not on file   Tobacco Use   . Smoking status: Never Smoker   . Smokeless tobacco: Never Used   Vaping Use   . Vaping Use: Never used   Substance and Sexual Activity   . Alcohol use: Defer   . Drug use: Defer   . Sexual activity: Not on file   Other Topics Concern   . Not on file   Social History Narrative   . Not on file     Social Determinants of Health     Financial Resource Strain: Not on file   Food Insecurity: Not on file   Transportation Needs: Not on file   Physical Activity: Not on file   Stress: Not on file   Social Connections: Not on file   Intimate Partner Violence: Not on file   Housing Stability: Not on file        Medications:  Current Outpatient Medications   Medication Instructions   . acetaminophen (Tylenol) 325 mg tablet Take 2 tablets (650 mg) by mouth  every 6 (six) hours if needed for pain.   . cholecalciferol, vitamin D3, 100 mcg (4,000 unit) capsule No dose, route, or frequency recorded.   . ciprofloxacin-dexamethasone (CiproDEX) otic suspension otic (ear)   . esomeprazole (NEXIUM) 20 mg, oral   . gabapentin (NEURONTIN) 200 mg, oral, 3 times daily   . hydroCHLOROthiazide (HYDRODIURIL) 50 mg, oral, Daily   . LORazepam (ATIVAN) 1 mg, oral, 2 times daily   . meclizine (ANTIVERT) 25 mg, oral, 3 times daily   . metaproterenol (ALUPENT) 10 mg, oral, 3 times daily   . metFORMIN (GLUCOPHAGE) 500 mg, oral, 2 times daily   . nitrofurantoin, macrocrystal-monohydrate, (Macrobid) 100 mg capsule 100 mg, oral, Every 12 hours   . NovoFine Plus 32 gauge x 1/6" needle subcutaneous   . ofloxacin (Floxin) 0.3 % otic solution 5 drops, otic (ear), Daily RT   . omega-3 (Fish Oil) 300-1,000 mg capsule 1,000 mg, oral, Daily   . oxyCODONE (Roxicodone) 5 mg immediate release tablet Take 1 tablet (5 mg) by mouth every 6 (six) hours if needed for pain   . penicillin v potassium (VEETID) 500 mg, oral, 4 times daily   . phentermine 15 mg, oral,  Daily before breakfast   . simvastatin (ZOCOR) 80 mg, oral, Nightly   . Victoza 3-Pak 1.8 mg, subcutaneous, Daily        Allergies:  Allergies   Allergen Reactions   . Aspirin      Other reaction(s): PRIOR GI BLEED   . Nsaids (Non-Steroidal Anti-Inflammatory Drug)    . Other      SEASONAL ALLERGIES  SEASONAL ALLERGIES          Examination:  Gen: NAD, resting comfortably    On examination Kathy Morales is a pleasant-well appearing woman in no apparent distress.     Gait is non-antalgic.   Examination of the left knee reveals incisions are clean and intact.   No effusion, erythema, ecchymosis, or bony deformity.  There is no tenderness over the medial or lateral joint line.   ROM 0-130.   Mild pain with deep flexion.   No apprehension or excessive translation with patella manipulation.    Quad and patellar tendon are nontender without palpable gap.    Able to straight leg raise without extensor lag.    Knee is stable to varus and valgus stress test.   Stable Lachman's.   Negative posterior drawer.    Calf is soft and nontender.    Fires EHL/FHL/GS/TA.   Sensation intact to light touch in S/S/DP/SP/T nerve distributions.   Lower extremities are warm and well perfused with palpable DP and PT pulses.       Examination of the left shoulder reveals skin is clean and intact.   There is no erythema, edema, ecchymosis, or bony deformity.   There is mild tenderness over the bicepital groove,   No tenderness on AC joint, lateral cuff or posterior joint line.   AROM: Forward elevation  180, ER  90, IR  70.   PROM: Supine PROM forward elevation 180, ER  90, IR  70.   No crepitus.   Strength 5/5 supraspinatus, infraspinatus, and subscapularis.   Hawkins test: internal rotation of the arm with shoulder flexed and elbow flexed to 90 degrees produces no pain.   Speed test: supination of the forearm with the shoulder flexed and elbow extended produces mild pain.   Yergason's test: resisted internal rotation of the arm with traction applied  at elbow and arm produces  biceps tendon subluxation produces mild pain. [name]   O'Brien's test: resisted forward flexion of the arm with elbow in full extension with thumb pointed downward produces no pain.  Patient has mild proximal medial humerus pain with resisted external rotation  Sensation intact in median, ulnar, radial, and axillary nerve distributions.   Able to move all fingers and both thumbs without issue.  Hands are warm and well perfused with palpable radial pulses.    Imaging:   X-rays of left knee were obtained and reviewed today and demonstrate no acute fracture or dislocation.  Mild osteoarthritis of the knees noticed with subchondral sclerosis in the medial compartment    Assessment/Plan:   Kathy Morales is a 65 y.o. year old female patient who is s/p left partial medial meniscectomy on 02/20/2021.  Patient also presents today with complaints of left shoulder pain which has been present since July of this year.  MRI of the left shoulder showed partial tear of the supraspinatus.  Patient has not attended physical therapy as was previously recommended.  At this visit, we discussed the importance of pursuing conservative management of this tear with physical therapy.  We discussed that if physical therapy fails then we can discuss other treatment modalities.  We gave the patient a prescription for physical therapy at this visit as well.  Patient agreed that she would attend physical therapy to improve her left shoulder symptoms.  In regards to her left knee, her incisions are healing well and we recommend that the patient begin moderate range of motion and exercises for her knee.  Her sutures were removed at this visit.  In regards to the patient's left shoulder, patient will attend physical therapy and follow-up with [place] in 6 weeks.  Patient understood and agreed with the above plan.  Patient was seen and evaluated with Dr. Manson Passey.  All questions were answered.      Meribeth Mattes, MD PGY 2

## 2021-03-09 ENCOUNTER — Ambulatory Visit (HOSPITAL_BASED_OUTPATIENT_CLINIC_OR_DEPARTMENT_OTHER): Admitting: Orthopaedic Surgery

## 2021-04-13 ENCOUNTER — Encounter

## 2021-04-13 ENCOUNTER — Other Ambulatory Visit

## 2021-04-13 ENCOUNTER — Ambulatory Visit: Admit: 2021-04-13 | Discharge: 2021-04-13 | Payer: BLUE CROSS/BLUE SHIELD | Attending: Orthopaedic Surgery

## 2021-04-13 DIAGNOSIS — S46012D Strain of muscle(s) and tendon(s) of the rotator cuff of left shoulder, subsequent encounter: Secondary | ICD-10-CM

## 2021-04-13 MED ORDER — lidocaine (Xylocaine) 10 mg/mL (1 %) injection 3 mL
10 | Freq: Once | INTRAMUSCULAR | Status: AC | PRN
Start: 2021-04-13 — End: 2021-04-13
  Administered 2021-04-13: 22:00:00 3 mL via INTRAMUSCULAR

## 2021-04-13 MED ORDER — betamethasone acet,sod phos (Celestone) injection 12 mg
6 | Freq: Once | INTRAMUSCULAR | Status: AC | PRN
Start: 2021-04-13 — End: 2021-04-13
  Administered 2021-04-13: 22:00:00 12 mg via INTRA_ARTICULAR

## 2021-04-13 NOTE — Progress Notes (Signed)
 DEPARTMENT OF ORTHOPAEDIC SURGERY    REASON FOR VISIT: Post Op, s/p Left Knee arthroscopy, partial medial meniscectomy (DOS 02/20/21)  Follow-up, Left shoulder pain    SUBJECTIVE  Kathy Morales is a 65 y.o. female who presents today for follow-up evaluation of her left shoulder pain, as well as a post-operative visit s/p left knee arthroscopy, partial medial meniscectomy (DOS 02/20/2021). With regards to her left knee, she endorses discomfort at her arthroscopy portal sites. She denies any fevers, chills, wound erythema, or wound drainage. She reports no other concerns with her left knee. With regards to her left shoulder, she reports falling down stairs and injuring her left ribcage/axillary region on 12/14/20. She had a left shoulder MRI which demonstrated a partial thickness supraspinatus tear. She denies any pain at rest and she denies any stiffness. She has been going to PT twice weekly and doing exercises for her shoulder. She feels her shoulder ROM has improved. She endorses pain in the left shoulder when she is sleeping on her right side and her left shoulder shift anteriorly due to gravity. She also has exacerbation of her symptoms when she reaches behind with the left upper extremity to pull up her covers while in bed. She cannot take NSAIDs due to a history of GI bleeds. She had a left shoulder biceps tenodesis at [facility] in the mid-2000's. She reports no other acute concerns today.    Medical History:  Past Medical History:   Diagnosis Date   . Arthritis    . Asthma    . Breast cancer (CMS/HCC)     right   . Diabetes mellitus (CMS/HCC)     last AIC5.7   . GERD (gastroesophageal reflux disease) 11/18/2020   . Hyperlipidemia    . Hypertension    . Lumbar stenosis without neurogenic claudication 11/18/2020   . S/P lumbar fusion 11/18/2020   . Shoulder pain 11/2020    left-with partial ligament tear-  after fall   . Tachycardia        Surgical History:  Past Surgical History:   Procedure Laterality Date   . COSMETIC  SURGERY      facial   . HYSTERECTOMY     . LUMBAR FUSION     . MASTECTOMY Right    . SHOULDER SURGERY         Current Medications:  Current Outpatient Medications   Medication Sig Dispense Refill   . acetaminophen (Tylenol) 325 mg tablet Take 2 tablets (650 mg) by mouth every 6 (six) hours if needed for pain. 40 tablet 0   . cholecalciferol, vitamin D3, 100 mcg (4,000 unit) capsule      . ciprofloxacin-dexamethasone (CiproDEX) otic suspension Administer into affected ear(s).     [name] esomeprazole (NexIUM) 20 mg DR capsule Take 20 mg by mouth.     . gabapentin (Neurontin) 100 mg capsule Take 200 mg by mouth in the morning, at noon, and at bedtime.     . hydroCHLOROthiazide (HYDRODiuril) 25 mg tablet Take 50 mg by mouth in the morning.     [name] LORazepam (Ativan) 1 mg tablet Take 1 mg by mouth in the morning and at bedtime.     . meclizine (Antivert) 25 mg tablet Take 25 mg by mouth in the morning, at noon, and at bedtime.     . metaproterenol (Alupent) 10 mg tablet Take 10 mg by mouth in the morning, at noon, and at bedtime.     . metFORMIN (Glucophage) 500 mg tablet Take 500 mg  by mouth in the morning and at bedtime.     . nitrofurantoin, macrocrystal-monohydrate, (Macrobid) 100 mg capsule Take 100 mg by mouth every 12 (twelve) hours.     . NovoFine Plus 32 gauge x 1/6" needle Inject under the skin.     [name] ofloxacin (Floxin) 0.3 % otic solution Administer 5 drops into affected ear(s) in the morning.     [name] omega-3 (Fish Oil) 300-1,000 mg capsule Take 1,000 mg by mouth in the morning.     [name] oxyCODONE (Roxicodone) 5 mg immediate release tablet Take 1 tablet (5 mg) by mouth every 6 (six) hours if needed for pain 10 tablet 0   . penicillin v potassium (Veetid) 500 mg tablet Take 500 mg by mouth in the morning, at noon, in the evening, and at bedtime.     . phentermine 15 mg capsule Take 15 mg by mouth before breakfast.     . simvastatin (Zocor) 80 mg tablet Take 80 mg by mouth at bedtime.     . Victoza 3-Pak 0.6 mg/0.1 mL (18  mg/3 mL) injection Inject 1.8 mg under the skin in the morning.       No current facility-administered medications for this visit.       Allergies:  Allergies   Allergen Reactions   . Aspirin      Other reaction(s): PRIOR GI BLEED   . Nsaids (Non-Steroidal Anti-Inflammatory Drug)    . Other      SEASONAL ALLERGIES  SEASONAL ALLERGIES         Social History:  Social History     Socioeconomic History   . Marital status: Married     Spouse name: Not on file   . Number of children: Not on file   . Years of education: Not on file   . Highest education level: Not on file   Occupational History   . Not on file   Tobacco Use   . Smoking status: Never   . Smokeless tobacco: Never   Vaping Use   . Vaping Use: Never used   Substance and Sexual Activity   . Alcohol use: Defer   . Drug use: Defer   . Sexual activity: Not on file   Other Topics Concern   . Not on file   Social History Narrative   . Not on file     Social Determinants of Health     Financial Resource Strain: Not on file   Food Insecurity: Not on file   Transportation Needs: Not on file   Physical Activity: Not on file   Stress: Not on file   Social Connections: Not on file   Intimate Partner Violence: Not on file   Housing Stability: Not on file     Tobacco Use: Low Risk    . Smoking Tobacco Use: Never   . Smokeless Tobacco Use: Never   . Passive Exposure: Not on file     Alcohol Use: Not on file     Social History     Substance and Sexual Activity   Drug Use Defer       Family History:  Family History   Family history unknown: Yes       Review of Systems:  Review of Systems otherwise negative except as noted above in the HPI.    PHYSICAL EXAM  Kathy Morales is a pleasant-well appearing woman in no apparent distress. Gait is non-antalgic.     Examination of the left knee reveals well-healed arthroscopic  incision sites. There is tenderness to palpation at the arthroscopic incision sites. There is no effusion, erythema, ecchymosis, or obvious deformity. Left knee ROM  0-130. Fires EHL/FHL/GS/TA. Sensation intact to light touch in S/S/DP/SP/T nerve distributions. Lower extremities are warm and well perfused.    Examination of the left shoulder reveals skin is clean and intact. There is no erythema, edema, ecchymosis, or bony deformity. There is tenderness to palpation over the bicipital groove. AROM: Forward elevation 180, ER 70, IR to lumbar spine. Strength 5/5 supraspinatus, infraspinatus, and subscapularis. Positive Speed's & Yergason's. Fires deltoid, biceps, triceps, WE/WF/FF/IO. Sensation intact in median, ulnar, radial, and axillary nerve distributions. Hands are warm and well perfused with palpable radial pulses.    No new imaging obtained today.     L Inj/Asp: L subacromial bursa on 04/13/2021 4:30 PM  Indications: pain  Medications: 12 mg betamethasone acet,sod phos 6 mg/mL; 3 mL lidocaine 10 mg/mL (1 %)  Outcome: tolerated well, no immediate complications  Procedure, treatment alternatives, risks and benefits explained, specific risks discussed. Consent was given by the patient. Immediately prior to procedure a time out was called to verify the correct patient, procedure, equipment, support staff and site/side marked as required. Patient was prepped and draped in the usual sterile fashion.         TREATMENT    Patient was seen and evaluated with Dr. Manson Passey today. Kathy Morales is a 65 y.o. female who presents today for follow-up evaluation of her left shoulder pain, as well as a post-operative visit s/p left knee arthroscopy, partial medial meniscectomy (DOS 02/20/2021). All findings were reviewed with her today. She is doing well overall with regards to her left knee. The expected recovery and post-operative course was reviewed. We discussed that the discomfort and scar tissue at her arthroscopic portal sites will improve over time, but this may take a while to resolve. With regards to her left shoulder, her clinical presentation and imaging consistent with left shoulder  rotator cuff/biceps tendonitis with prior MRI demonstrating partial supraspinatus tear. The nature and progression of the condition was discussed. Treatment options were discussed, including rest, topical therapy, activity modification, NSAIDs, physical therapy, and injections. We recommended that she continue with physical therapy and exercises at home. After discussion of these options, patient elected to proceed with a left shoulder subacromial cortisone injection. She tolerated this well without any immediate complications. We will see her back for follow-up evaluation in 4-6 weeks. She knows to call in the meantime with any questions or concerns. Patient expressed understanding and was agreeable with the plan. All questions answered.    Follow up: 4-6 weeks    Tawni Carnes, MD

## 2021-04-13 NOTE — Progress Notes (Signed)
 I saw and evaluated the patient, participating in the key portions of the service.  I reviewed the resident?s/fellow's note.  I agree with the resident?s/fellow's findings and plan.     Standley Brooking, MD

## 2021-05-18 ENCOUNTER — Encounter

## 2021-05-18 ENCOUNTER — Ambulatory Visit: Admit: 2021-05-18 | Discharge: 2021-05-18 | Payer: MEDICARE | Attending: Orthopaedic Surgery

## 2021-05-18 ENCOUNTER — Encounter (HOSPITAL_BASED_OUTPATIENT_CLINIC_OR_DEPARTMENT_OTHER): Admitting: Orthopaedic Surgery

## 2021-05-18 ENCOUNTER — Other Ambulatory Visit

## 2021-05-18 DIAGNOSIS — M25512 Pain in left shoulder: Secondary | ICD-10-CM

## 2021-05-18 NOTE — Progress Notes (Signed)
Department of Orthopaedic Surgery    CHIEF COMPLAINT: Follow-up status post left knee arthroscopy with partial medial meniscectomy and debridement and left shoulder impingement.    HPI:   The patient returns today for follow-up regarding the above problems.  Both her knee and her shoulder seem to have improved.  At her last visit, she underwent a left shoulder subacromial cortisone injection.  This provided her with good pain relief.  She has occasional positional left shoulder pain, but otherwise is happy with her progress.  She is returned to working with her trainer.  She also at times gets some occasional discomfort in the left knee but that also seems to be positional.  She is happy with her outcome.    Past Medical History:   Diagnosis Date   . Arthritis    . Asthma    . Breast cancer (CMS/HCC)     right   . Diabetes mellitus (CMS/HCC)     last AIC5.7   . GERD (gastroesophageal reflux disease) 11/18/2020   . Hyperlipidemia    . Hypertension    . Lumbar stenosis without neurogenic claudication 11/18/2020   . S/P lumbar fusion 11/18/2020   . Shoulder pain 11/2020    left-with partial ligament tear-  after fall   . Tachycardia      Past Surgical History:   Procedure Laterality Date   . COSMETIC SURGERY      facial   . HYSTERECTOMY     . LUMBAR FUSION     . MASTECTOMY Right    . SHOULDER SURGERY       Family History   Family history unknown: Yes     Social History     Socioeconomic History   . Marital status: Married     Spouse name: Not on file   . Number of children: Not on file   . Years of education: Not on file   . Highest education level: Not on file   Occupational History   . Not on file   Tobacco Use   . Smoking status: Never   . Smokeless tobacco: Never   Vaping Use   . Vaping Use: Never used   Substance and Sexual Activity   . Alcohol use: Defer   . Drug use: Defer   . Sexual activity: Not on file   Other Topics Concern   . Not on file   Social History Narrative   . Not on file     Social Determinants  of Health     Financial Resource Strain: Not on file   Food Insecurity: Not on file   Transportation Needs: Not on file   Physical Activity: Not on file   Stress: Not on file   Social Connections: Not on file   Intimate Partner Violence: Not on file   Housing Stability: Not on file     ALLERGIES: Aspirin, Nsaids (non-steroidal anti-inflammatory drug), and Other    MEDICATIONS:   Prior to Admission medications    Medication Sig Start Date End Date Taking? Authorizing Provider   acetaminophen (Tylenol) 325 mg tablet Take 2 tablets (650 mg) by mouth every 6 (six) hours if needed for pain. 02/20/21   Beryle Beams, MD   cholecalciferol, vitamin D3, 100 mcg (4,000 unit) capsule     Historical Provider, MD   ciprofloxacin-dexamethasone (CiproDEX) otic suspension Administer into affected ear(s). 09/05/20   Historical Provider, MD   esomeprazole (NexIUM) 20 mg DR capsule Take 20 mg by mouth.  Historical Provider, MD   gabapentin (Neurontin) 100 mg capsule Take 200 mg by mouth in the morning, at noon, and at bedtime. 08/31/20   Historical Provider, MD   hydroCHLOROthiazide (HYDRODiuril) 25 mg tablet Take 50 mg by mouth in the morning. 11/15/20   Historical Provider, MD   LORazepam (Ativan) 1 mg tablet Take 1 mg by mouth in the morning and at bedtime. 09/30/20   Historical Provider, MD   meclizine (Antivert) 25 mg tablet Take 25 mg by mouth in the morning, at noon, and at bedtime. 08/31/20   Historical Provider, MD   metaproterenol (Alupent) 10 mg tablet Take 10 mg by mouth in the morning, at noon, and at bedtime.    Historical Provider, MD   metFORMIN (Glucophage) 500 mg tablet Take 500 mg by mouth in the morning and at bedtime. 11/06/20   Historical Provider, MD   nitrofurantoin, macrocrystal-monohydrate, (Macrobid) 100 mg capsule Take 100 mg by mouth every 12 (twelve) hours. 07/29/20   Historical Provider, MD   NovoFine Plus 32 gauge x 1/6" needle Inject under the skin. 07/16/20   Historical Provider, MD   ofloxacin (Floxin) 0.3 %  otic solution Administer 5 drops into affected ear(s) in the morning.    Historical Provider, MD   omega-3 (Fish Oil) 300-1,000 mg capsule Take 1,000 mg by mouth in the morning.    Historical Provider, MD   oxyCODONE (Roxicodone) 5 mg immediate release tablet Take 1 tablet (5 mg) by mouth every 6 (six) hours if needed for pain 02/20/21   Beryle Beams, MD   penicillin v potassium (Veetid) 500 mg tablet Take 500 mg by mouth in the morning, at noon, in the evening, and at bedtime. 08/21/20   Historical Provider, MD   phentermine 15 mg capsule Take 15 mg by mouth before breakfast.    Historical Provider, MD   simvastatin (Zocor) 80 mg tablet Take 80 mg by mouth at bedtime.    Historical Provider, MD   Victoza 3-Pak 0.6 mg/0.1 mL (18 mg/3 mL) injection Inject 1.8 mg under the skin in the morning. 10/30/20   Historical Provider, MD        VITAL SIGNS: There were no vitals taken for this visit.     PHYSICAL EXAM:  Left Shoulder:  The left shoulder demonstrates excellent passive and active range of motion.  The contralateral shoulder demonstrates full passive and active range of motion as well.  There is no tenderness along the clavicle, AC joint or biceps tendon.  There is some slight tenderness over the anterior humeral head.  There is a positive Neer and Hawkins impingement test.  Negative Jobe's.  Negative belly press, negative lift-off, negative bear hugger.  There is 5/5 strength with resisted external and internal rotation.  Negative speed's, negative Yergason's.  Negative Spurling's.  There is normal sensation and strength in the C5 through T1 dermatomes.  Distally in the limbs are neurovascularly intact.  The hand is warm and well perfused with brisk capillary refill in a 2+ radial pulse.    Left knee: The incisions are well-healed.  She has minimal swelling or effusion.  There is no tenderness along the medial or lateral joint line.  Excellent passive and active range of motion.  Distally, the limb is  neurovascular intact.          Diagnosis Plan   1. Acute pain of left shoulder        2. Subacromial impingement of left shoulder  3. Acute medial meniscus tear of left knee, sequela            TREATMENT:  The patient is doing well at this point in regards to both her shoulder and her knee.  She will continue to work with her trainer and gradually increase her activities.  At this point, she will return for follow-up as needed.     I encouraged the patient to call the office or return with any increased questions, problems or concerns.  All of the patient's questions were answered to their satisfaction and they are in agreement with the treatment plan.    Standley Brooking, MD

## 2022-09-29 IMAGING — MG MAMMOGRAPHY SCREENING LEFT UNILATERAL 3D TOMO
4 series · 4 of 12 positions shown · non-contrast
Comparison: None.

________________________________________________________________________________________________ 
MAMMOGRAPHY SCREENING LEFT UNILATERAL 3D TOMO, 09/29/2022 [DATE]: 
CLINICAL INDICATION: Screening mammogram.
TECHNIQUE: Unilateral left breast digital mammography and 3-D Tomosynthesis were 
obtained. In addition, computer-aided detection was utilized. 
BREAST DENSITY: (Level B) There are scattered areas of fibroglandular density.

[L CC]
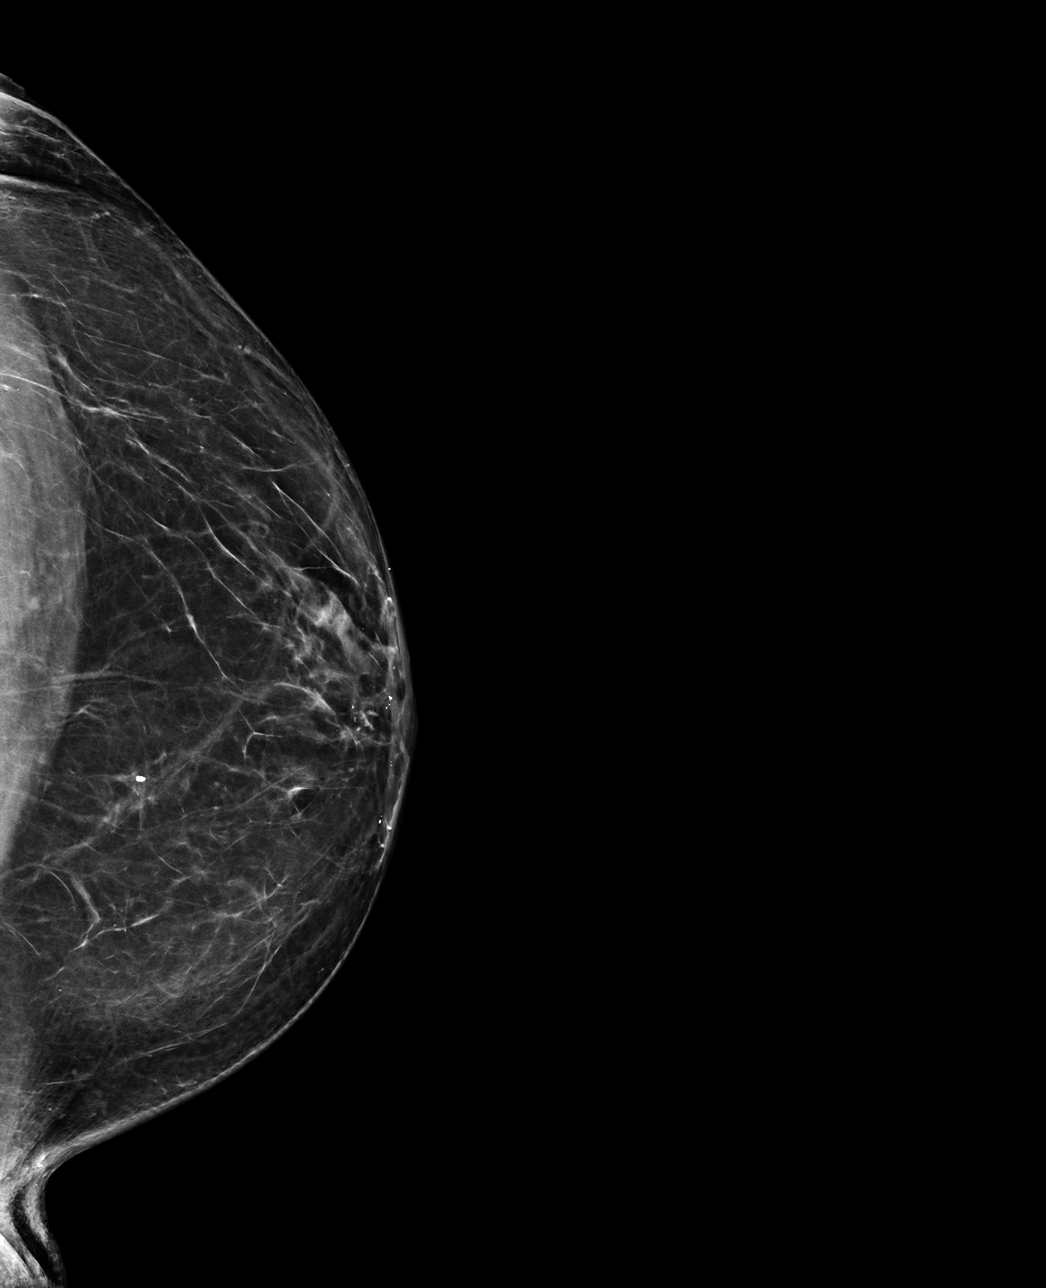

[L MLO]
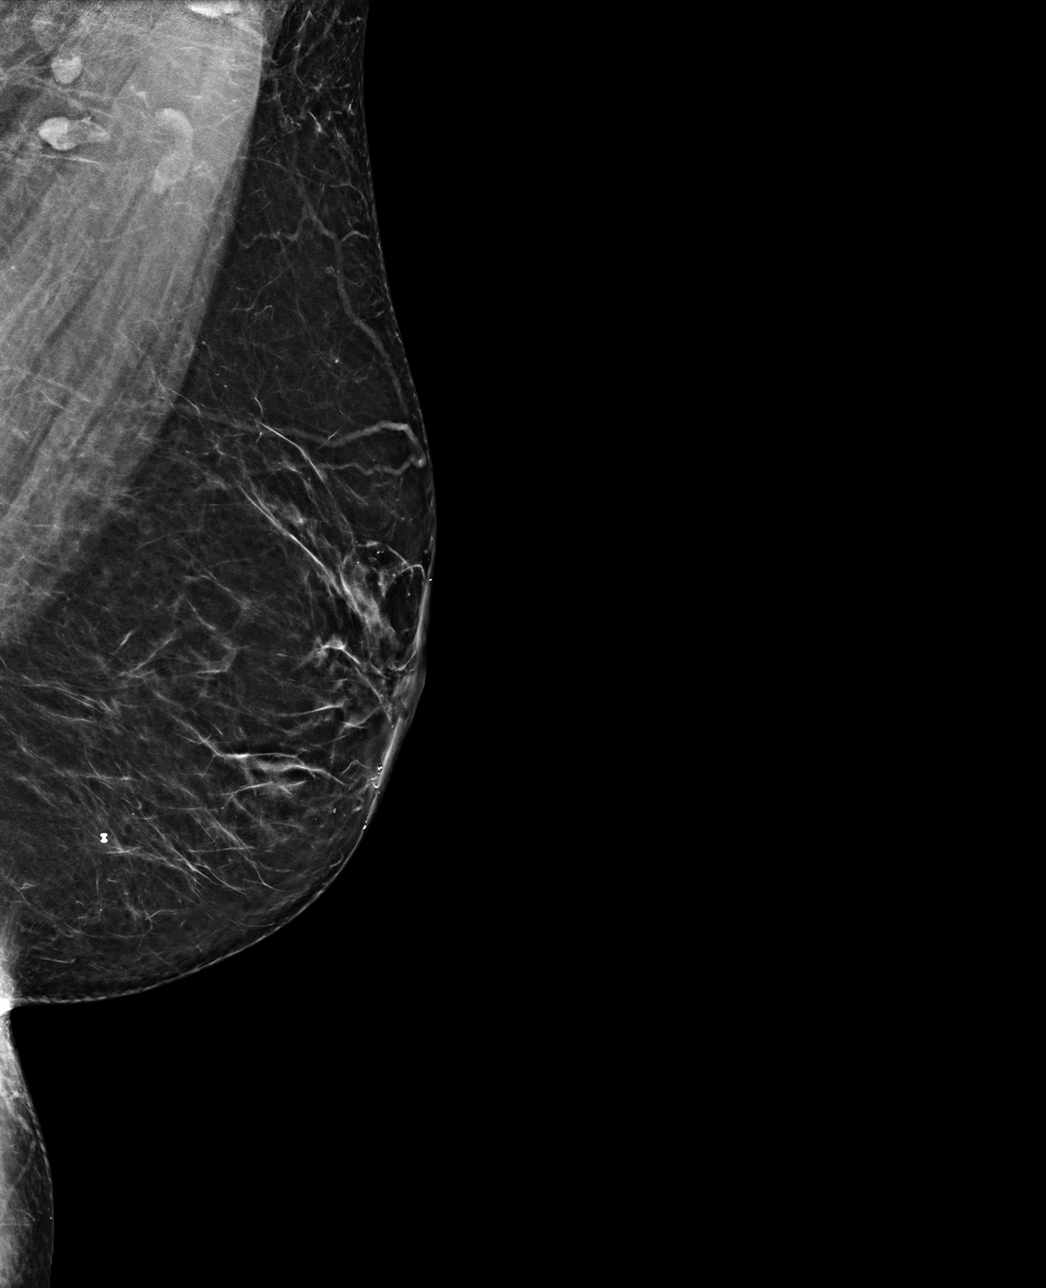

[L MLO tomo · tomo slice 35/69.0]
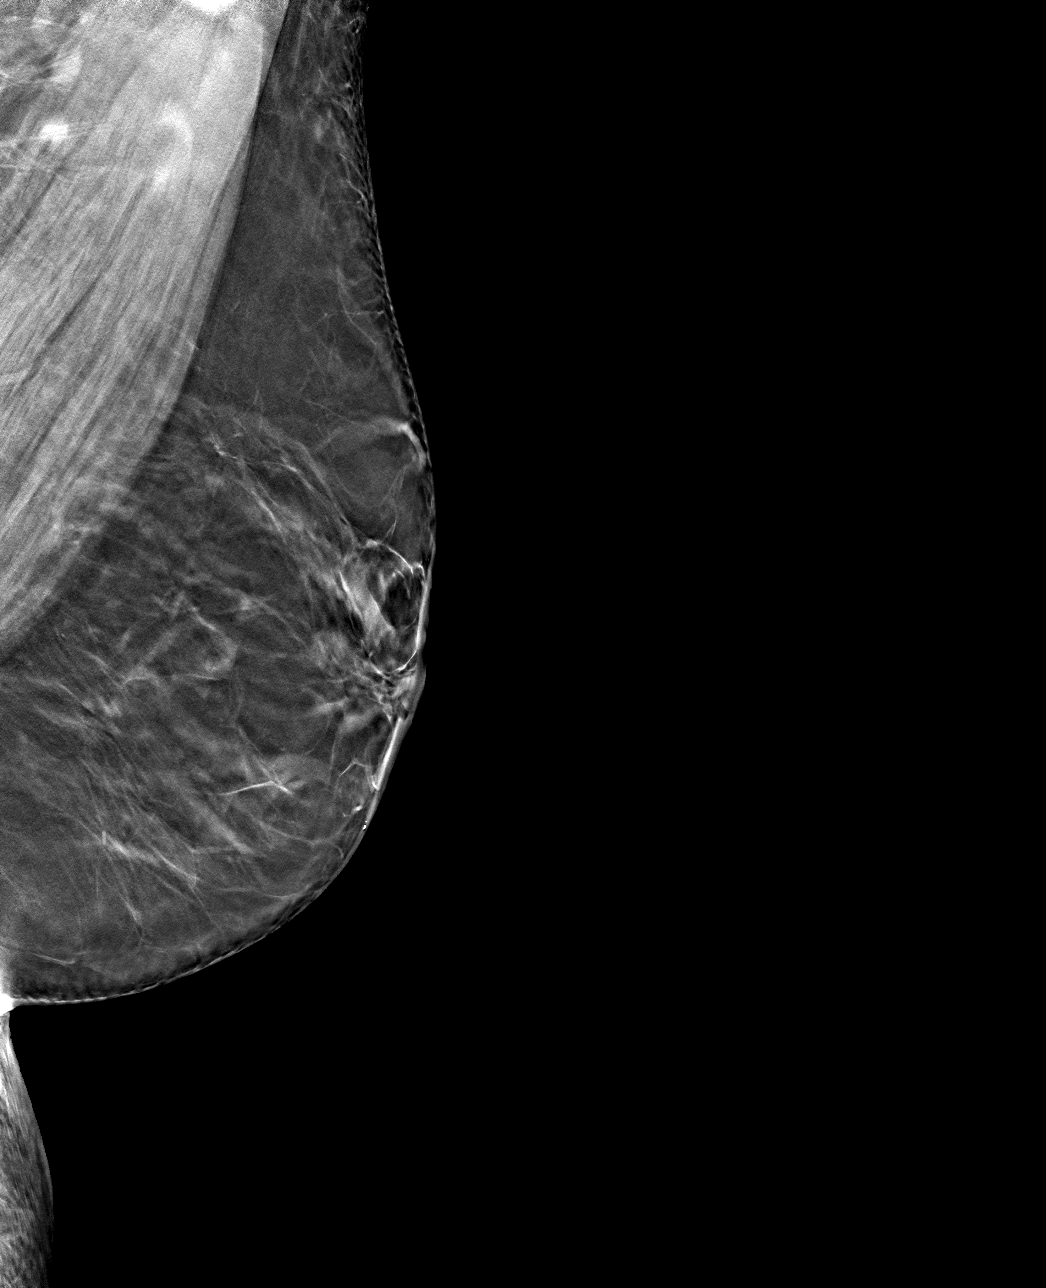

[L CC tomo · tomo slice 37/73.0]
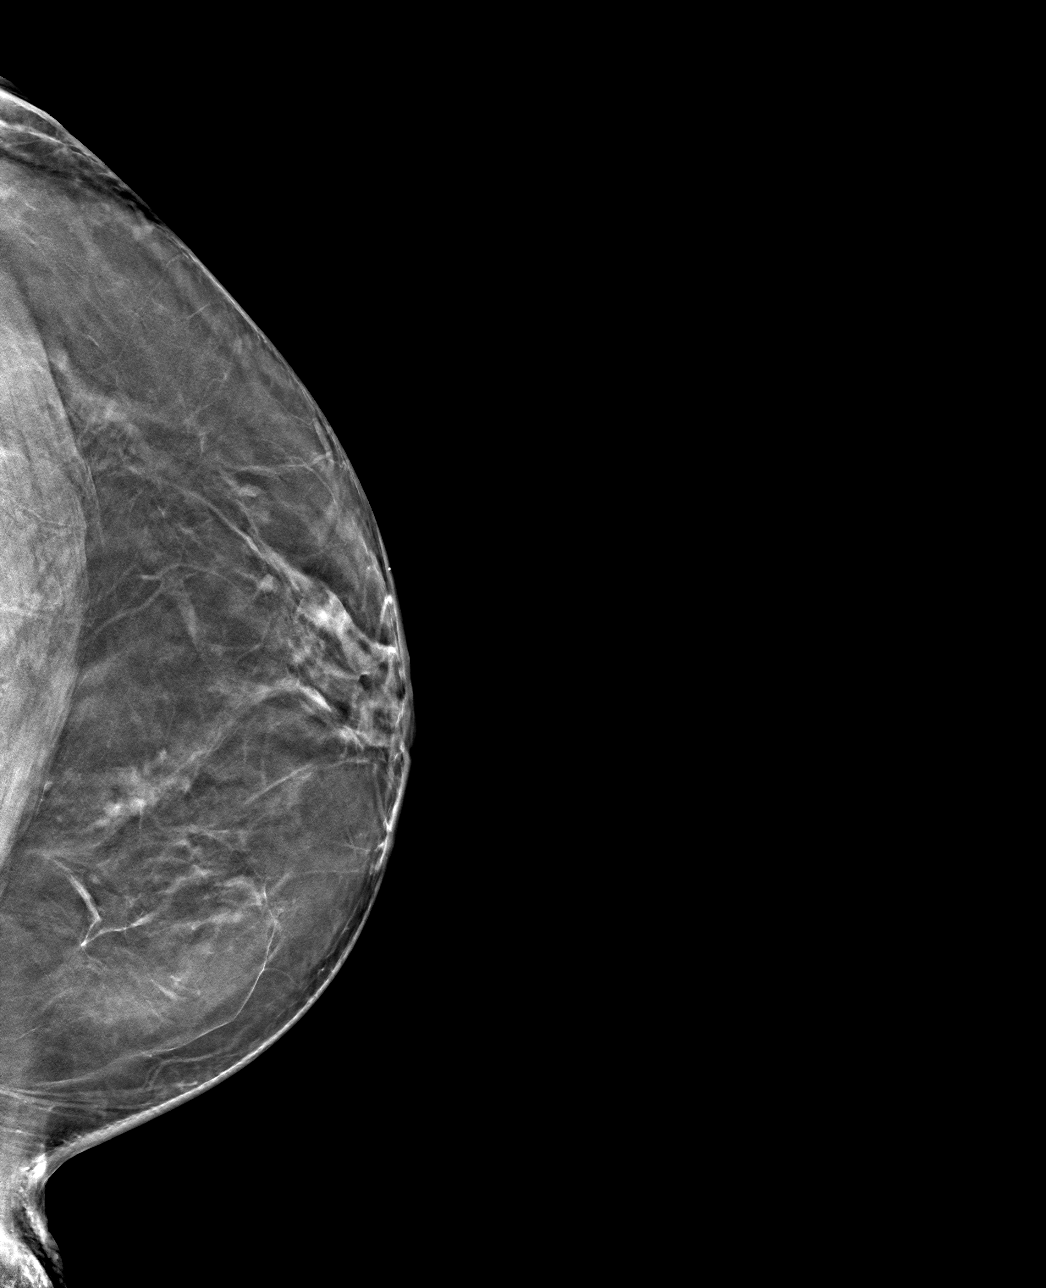

[4 of 12 positions shown; findings below may reference images not displayed]

FINDINGS: Biopsy clip. Benign calcification. No suspicious mass, 
calcifications, or area of architectural distortion in the left breast.
IMPRESSION: No dominant mass or suspicious calcification.  
(BI-RADS 2) Benign findings. Routine mammographic follow-up is recommended.

## 2022-12-03 IMAGING — DX CHEST PA AND LATERAL
1 series · 2 of 2 positions shown · non-contrast
Comparison: none

________________________________________________________________________________________________ 
CLINICAL INDICATION:  Spondylosis, right mastectomy
TECHNIQUE: PA and lateral radiographs of the chest are compared to none.

[Series 1: PA · U · 0.14mm/px · 2 of 2 slices shown]
[im 1/2]
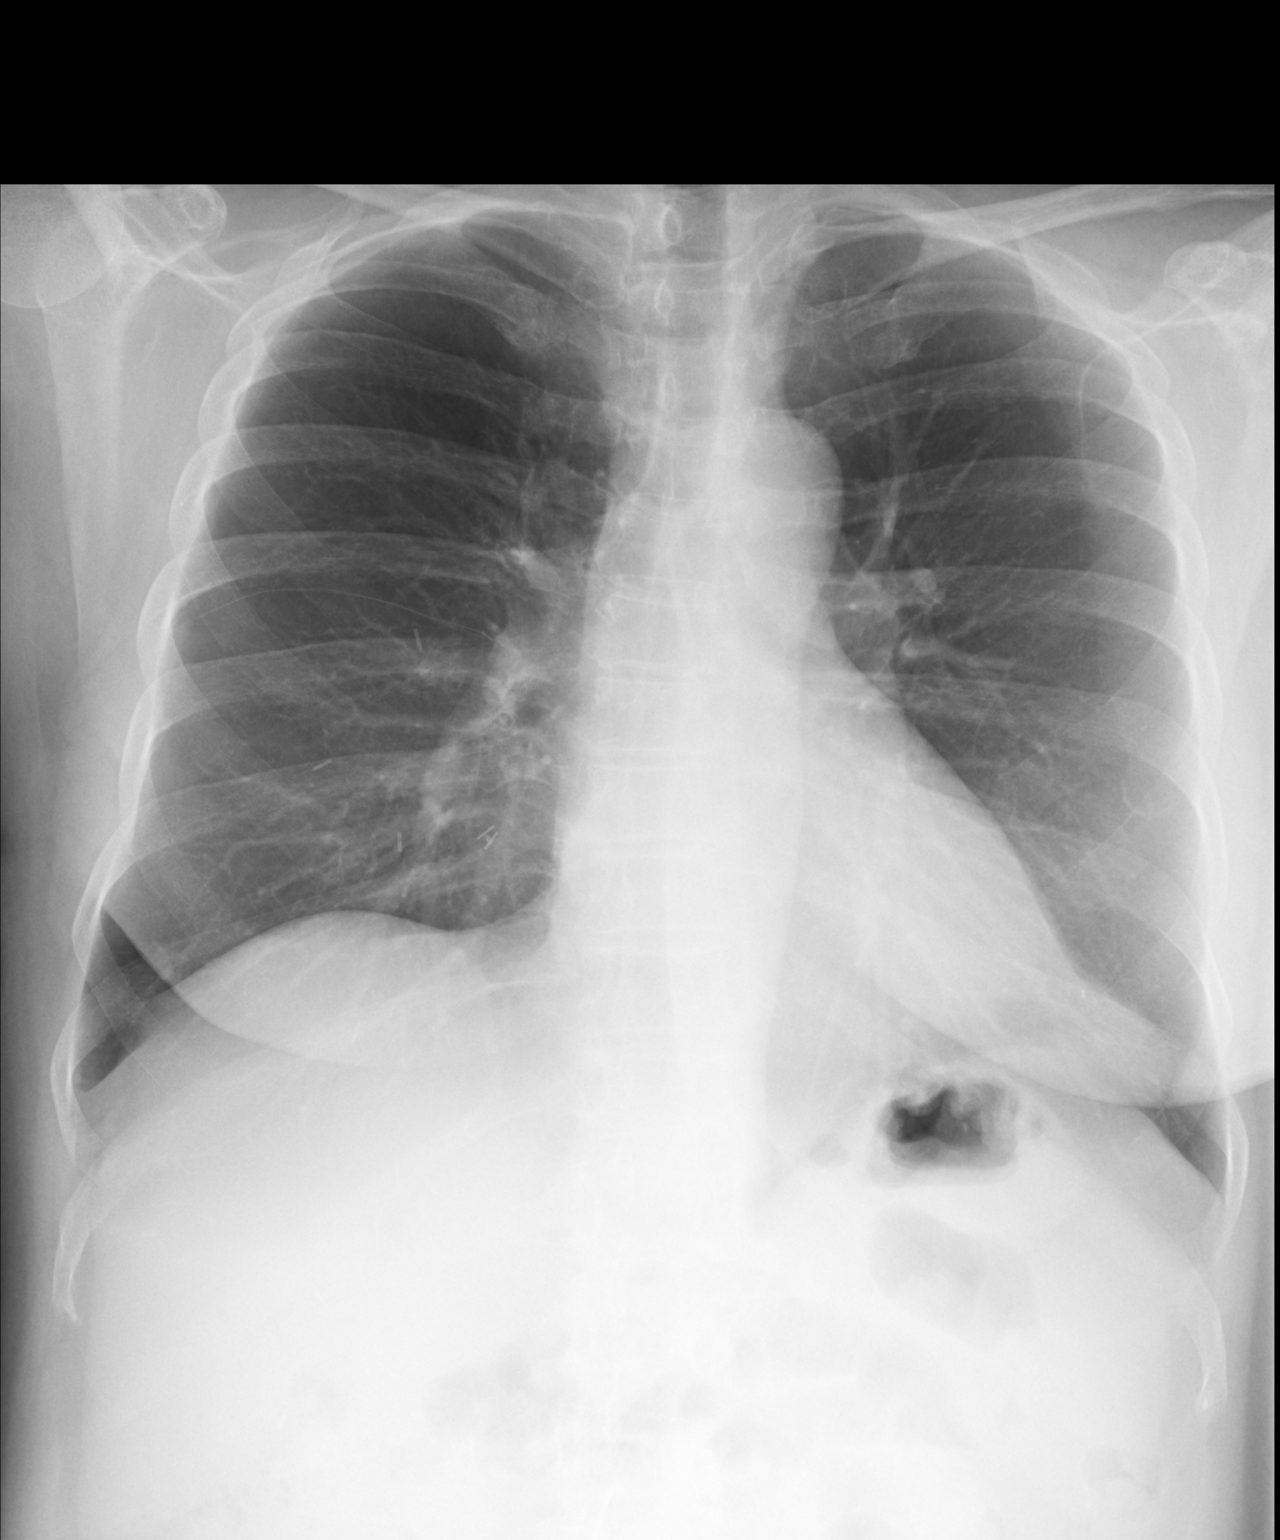
[im 2/2]
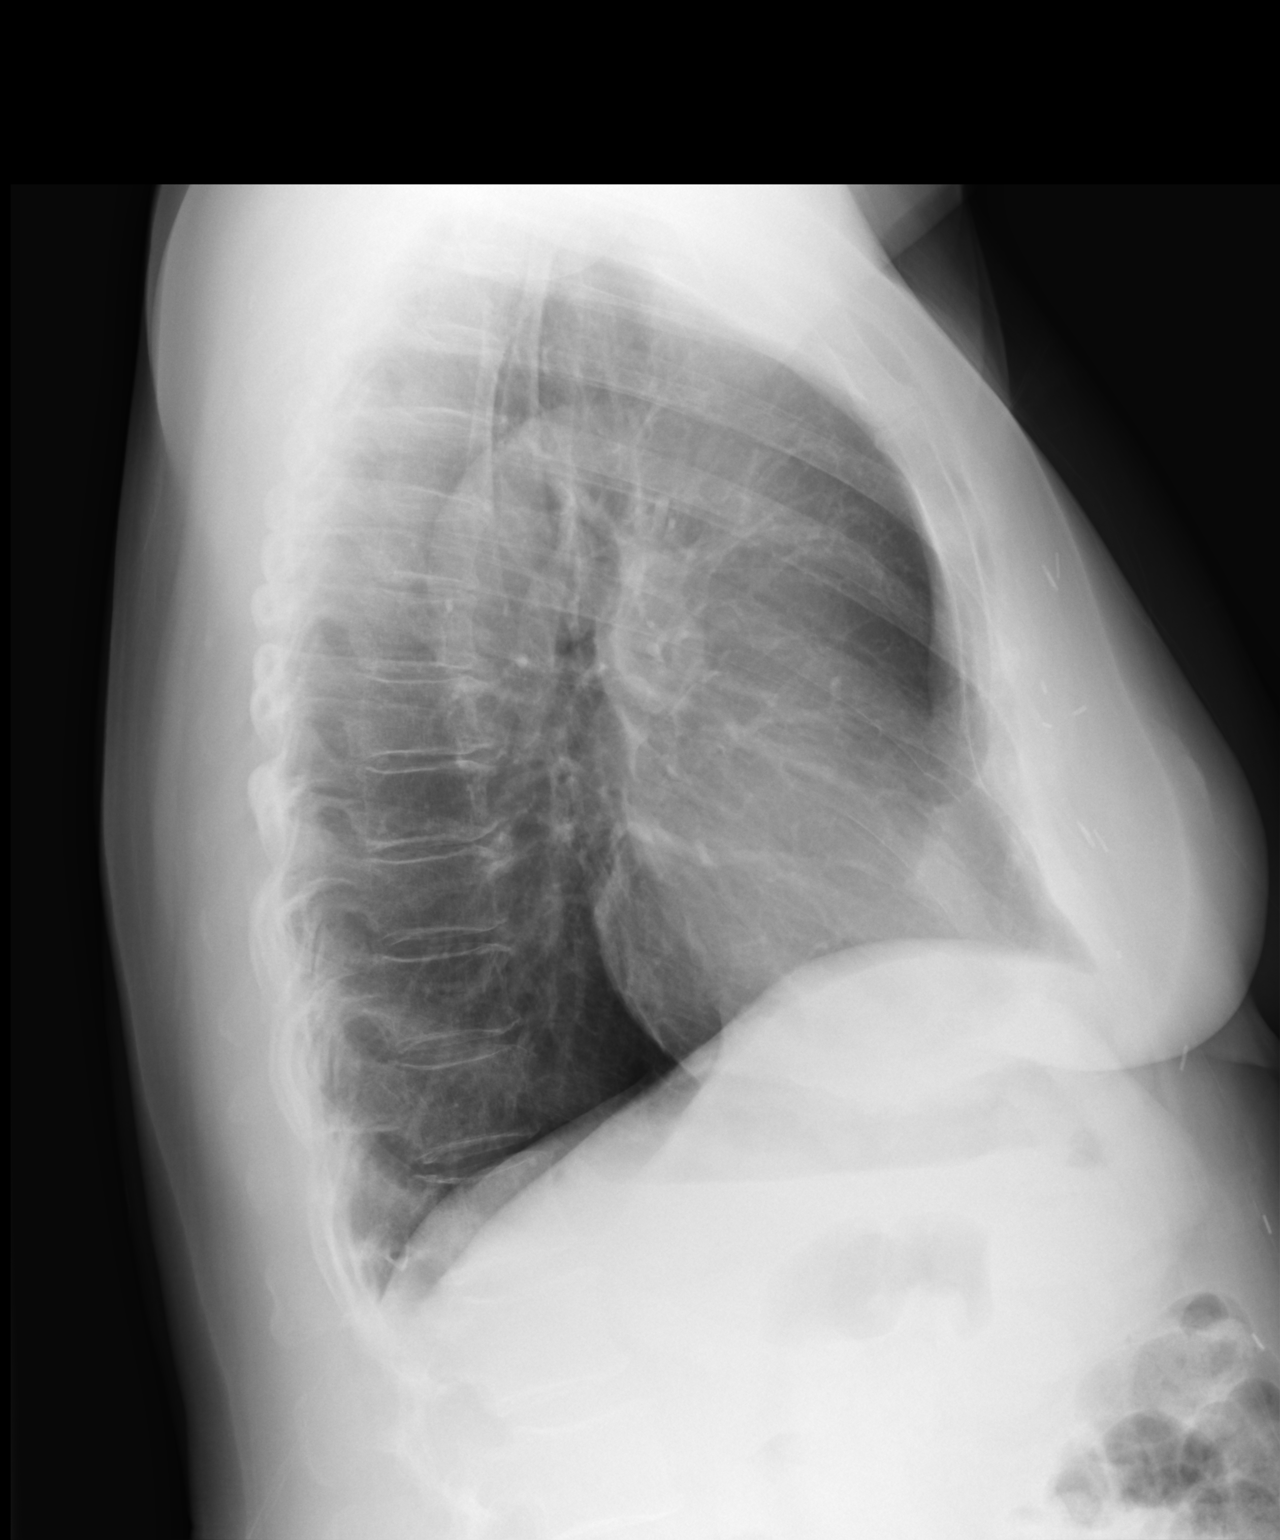

[2 of 2 positions shown; findings below may reference images not displayed]

FINDINGS: Lungs well-expanded. No infiltrate or effusion. Heart is not 
enlarged. Surgical clips in the right breast overlie the lower right chest.
IMPRESSION: No active cardiopulmonary findings. 
Right breast postsurgical change

## 2022-12-03 IMAGING — DX LUMBAR SPINE AP, LAT WITH FLEXION AND EXTEN
1 series · 4 of 4 positions shown · non-contrast
Comparison: None

________________________________________________________________________________________________ 
LUMBAR SPINE AP, LAT WITH FLEXION AND EXTEN, 12/03/2022 [DATE]: 
CLINICAL INDICATION: Other Spondylosis, Lumbar Region

[Series 1: AP · U · 0.14mm/px · 4 of 4 slices shown]
[im 1/4]
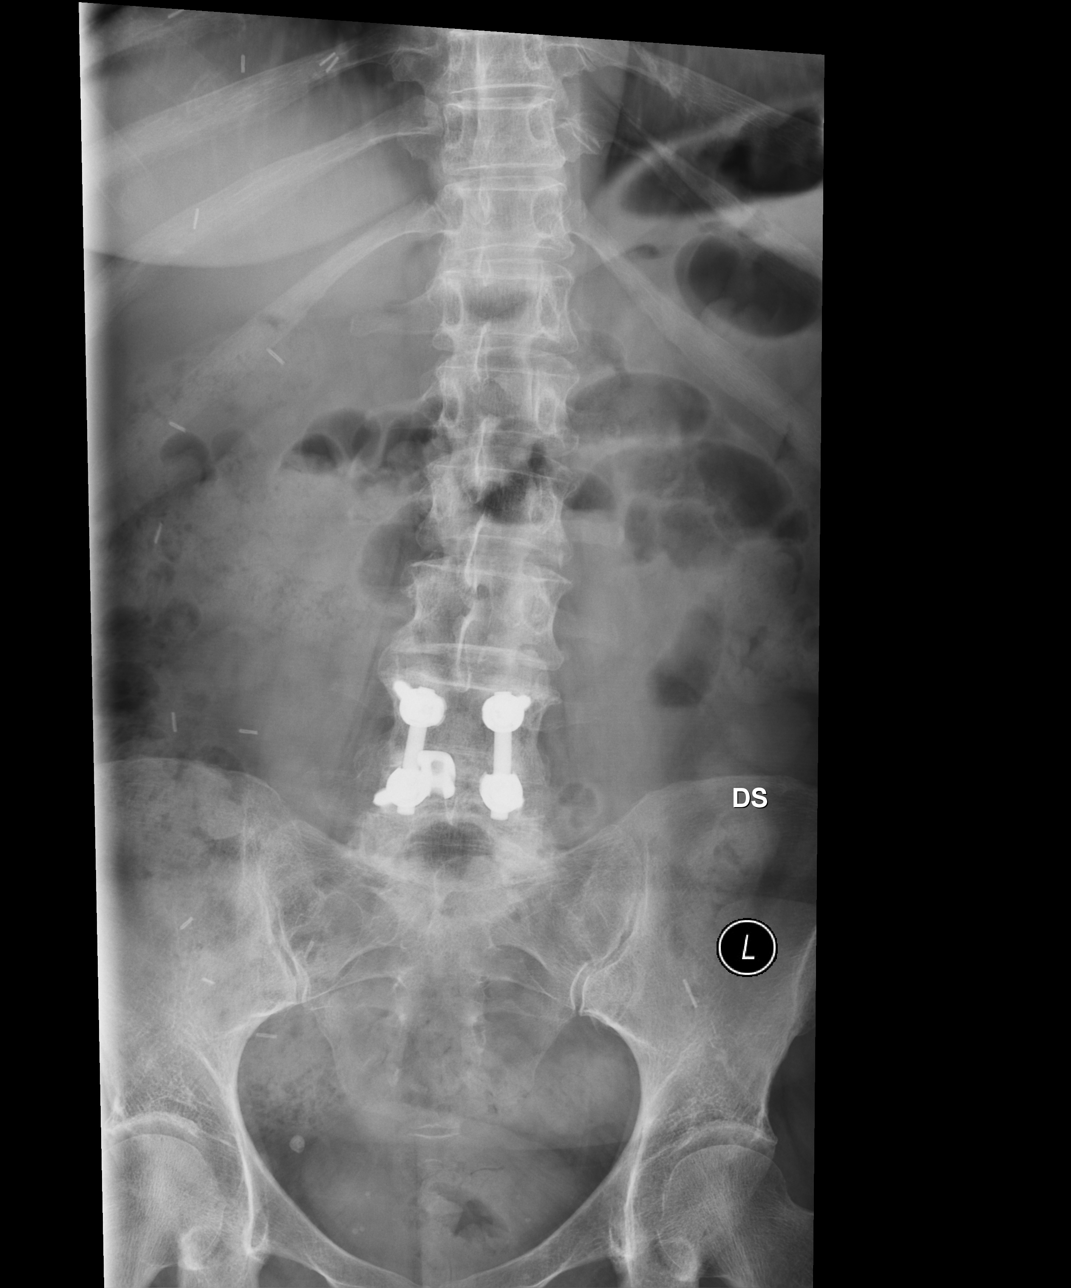
[im 2/4]
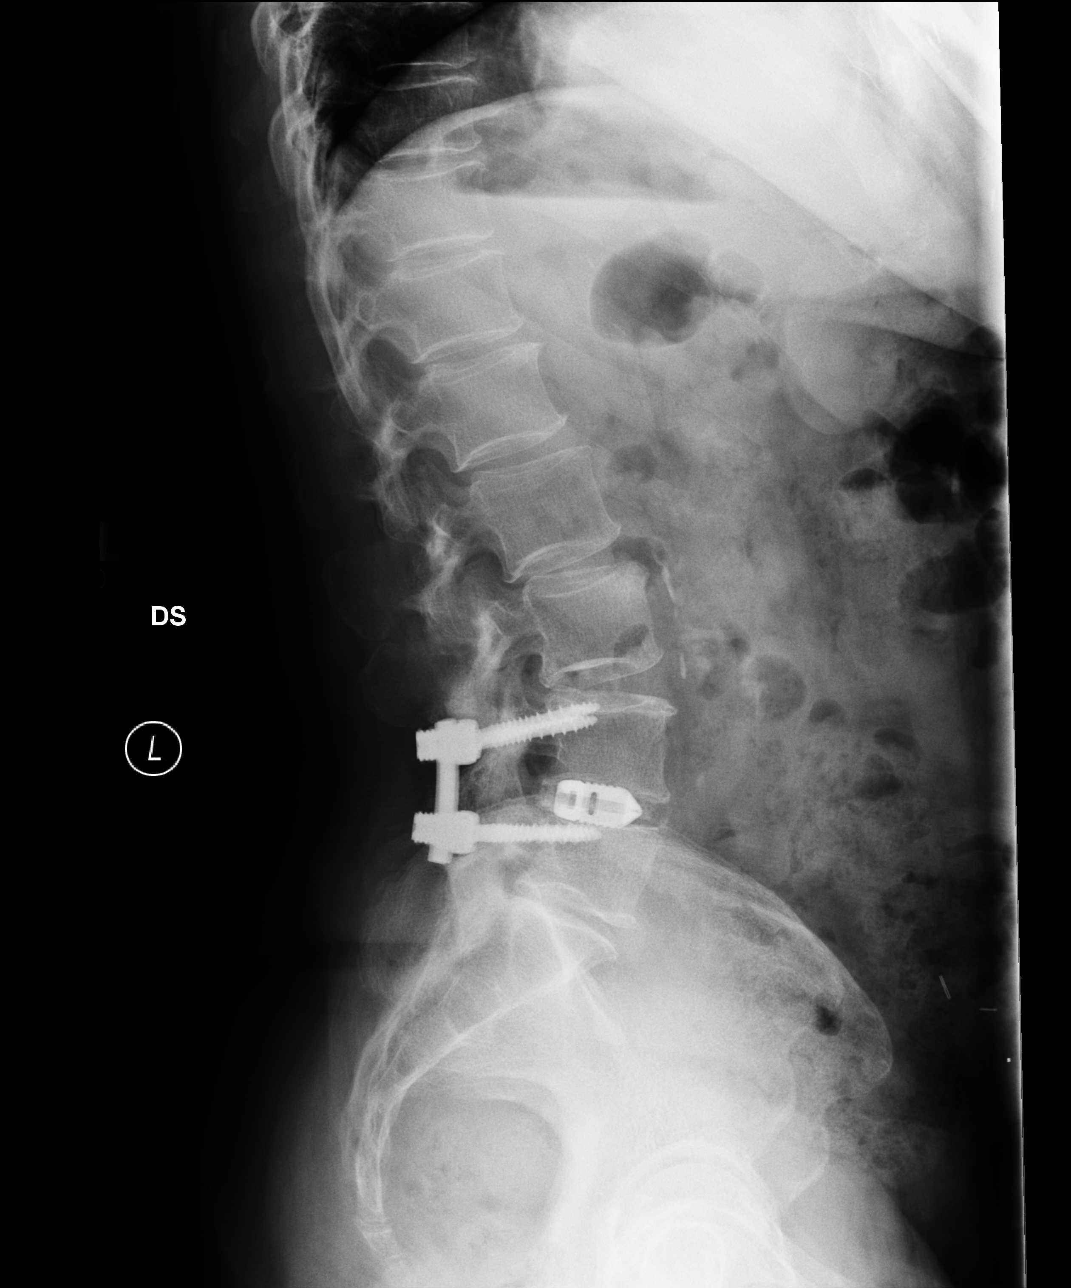
[im 3/4]
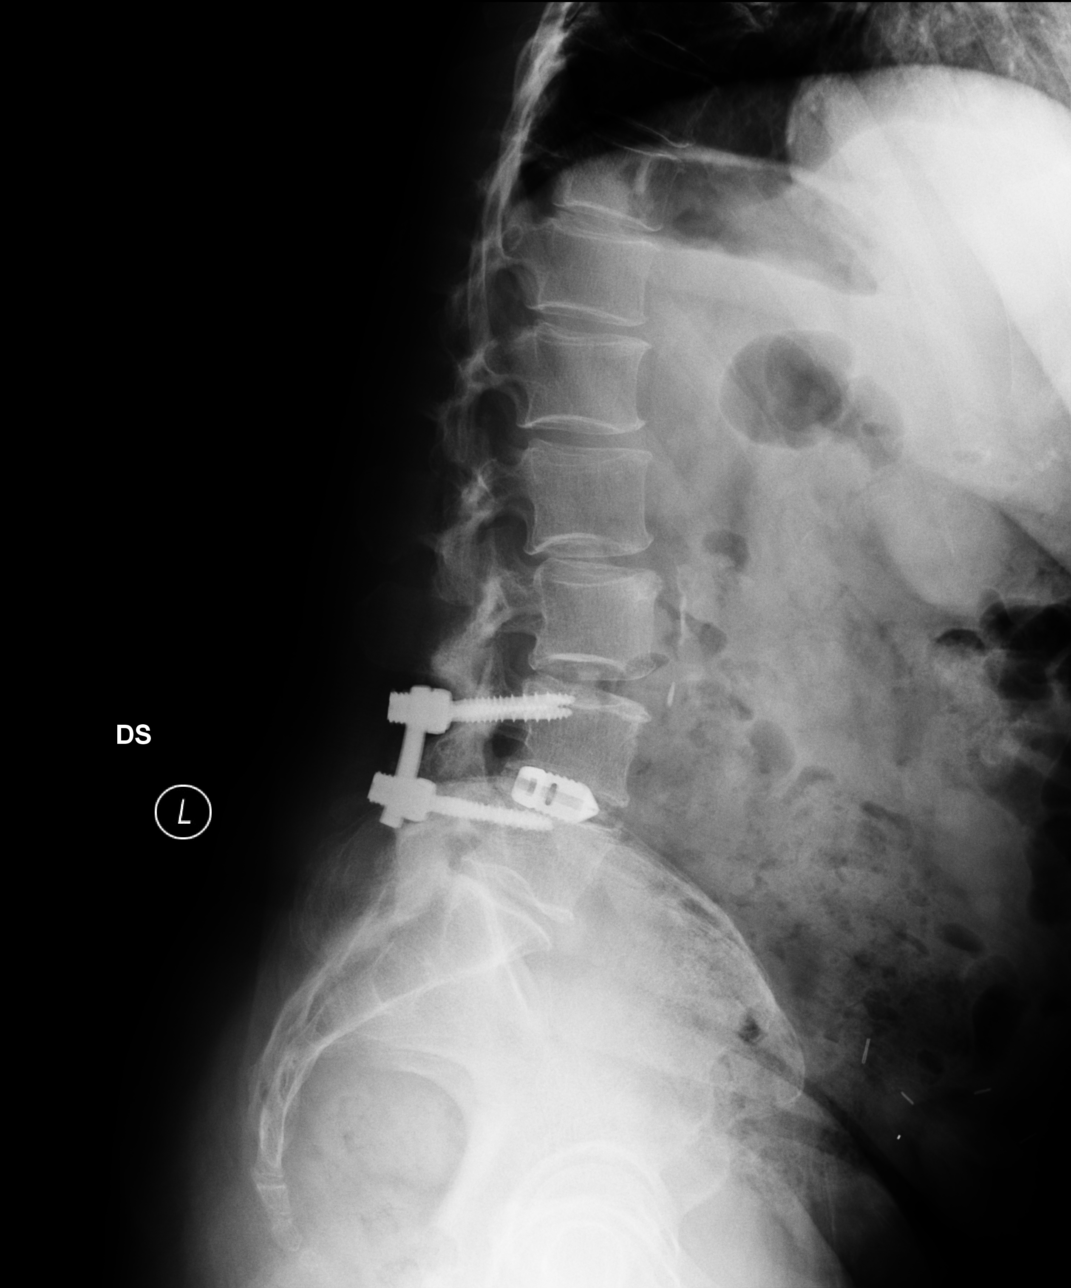
[im 4/4]
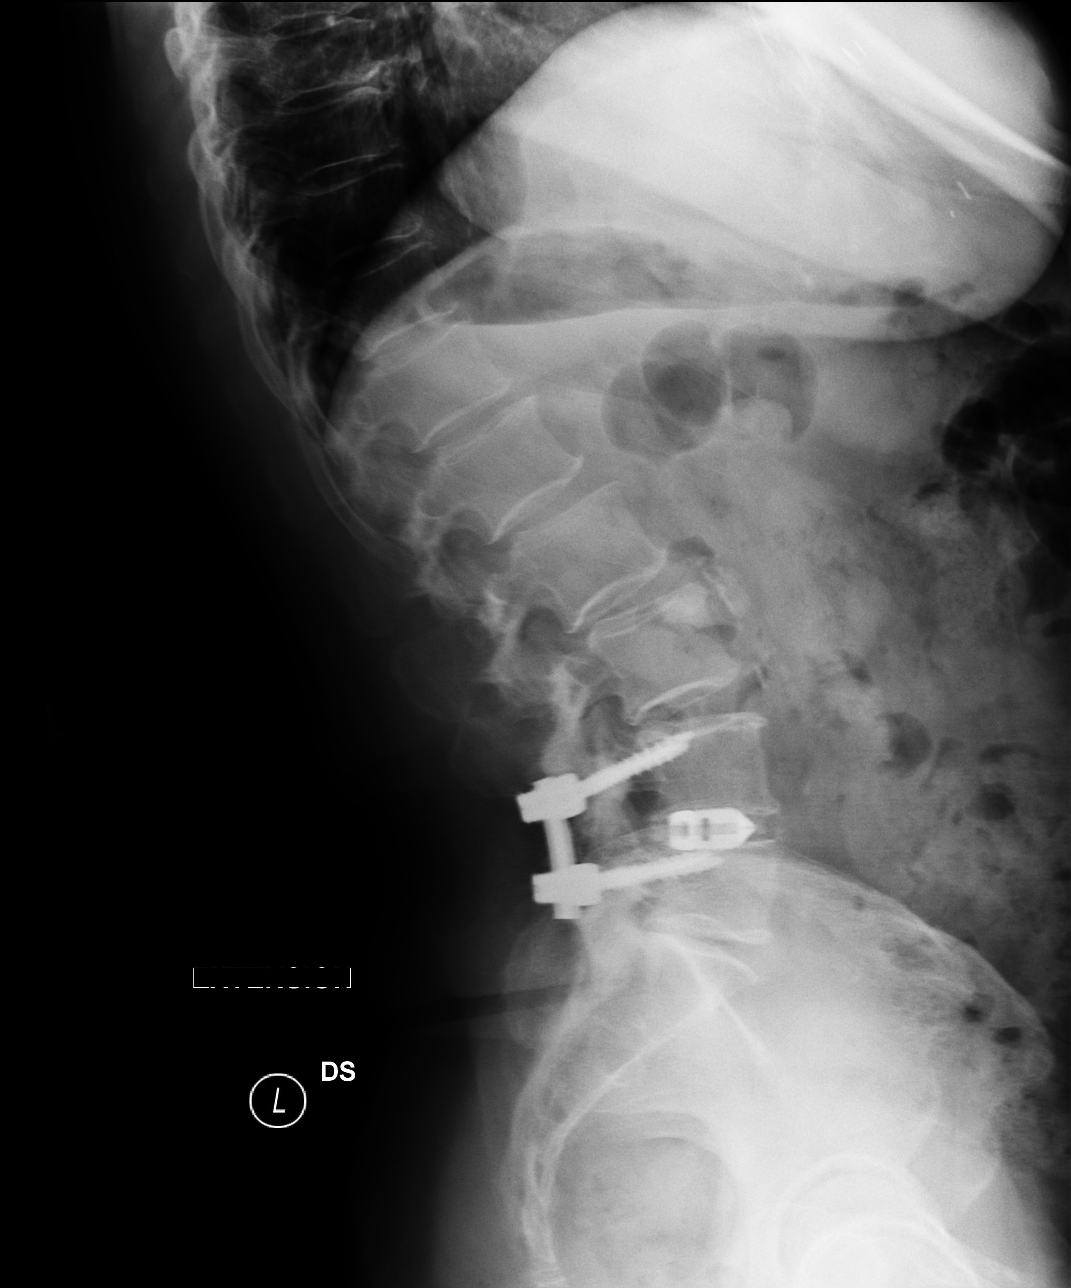

[4 of 4 positions shown; findings below may reference images not displayed]

FINDINGS: 5 lumbar type vertebral bodies. The T12 level contains small ribs 
bilaterally. Anterior posterior spinal fusion L4-L5 with bilateral pedicle 
screws and longitudinal connecting rods. Decompressive laminectomy. Anterior 
interbody graft material L4-L5. No hardware loosening or fracture. Mild 
levoconvex lumbar scoliosis. 5 mm retrolisthesis L1 on L2 reduces to 3 mm with 
flexion and increases to 6 mm with extension. 4 mm retrolisthesis L2 on L3 is 
stable with flexion and extension. Vertebral body height preserved. No fracture. 
Mineralization of the L3-L4 disc is suspected. Mild multilevel loss of disc 
height. 
Lumbar facet arthropathy. Aortic atherosclerotic changes. Surgical clips within 
the abdomen. Pelvic ring intact.
IMPRESSION: Degenerative and scoliotic changes. 
Mild degree of dynamic instability L1 on L2. 
Slight retrolisthesis L2 on L3 without dynamic instability. 
Surgical changes as above.

## 2023-06-06 IMAGING — DX LUMBAR SPINE AP, LAT WITH FLEXION AND EXTEN
4 series · 4 of 4 positions shown · non-contrast
Comparison: Lumbar spine x-ray December 03, 2022.

________________________________________________________________________________________________ 
LUMBAR SPINE AP, LAT WITH FLEXION AND EXTEN, 06/06/2023 [DATE]: 
CLINICAL INDICATION: Radiculopathy, lumbar region, multiple lumbar surgeries. 
Streaky of breast cancer.

[AP]
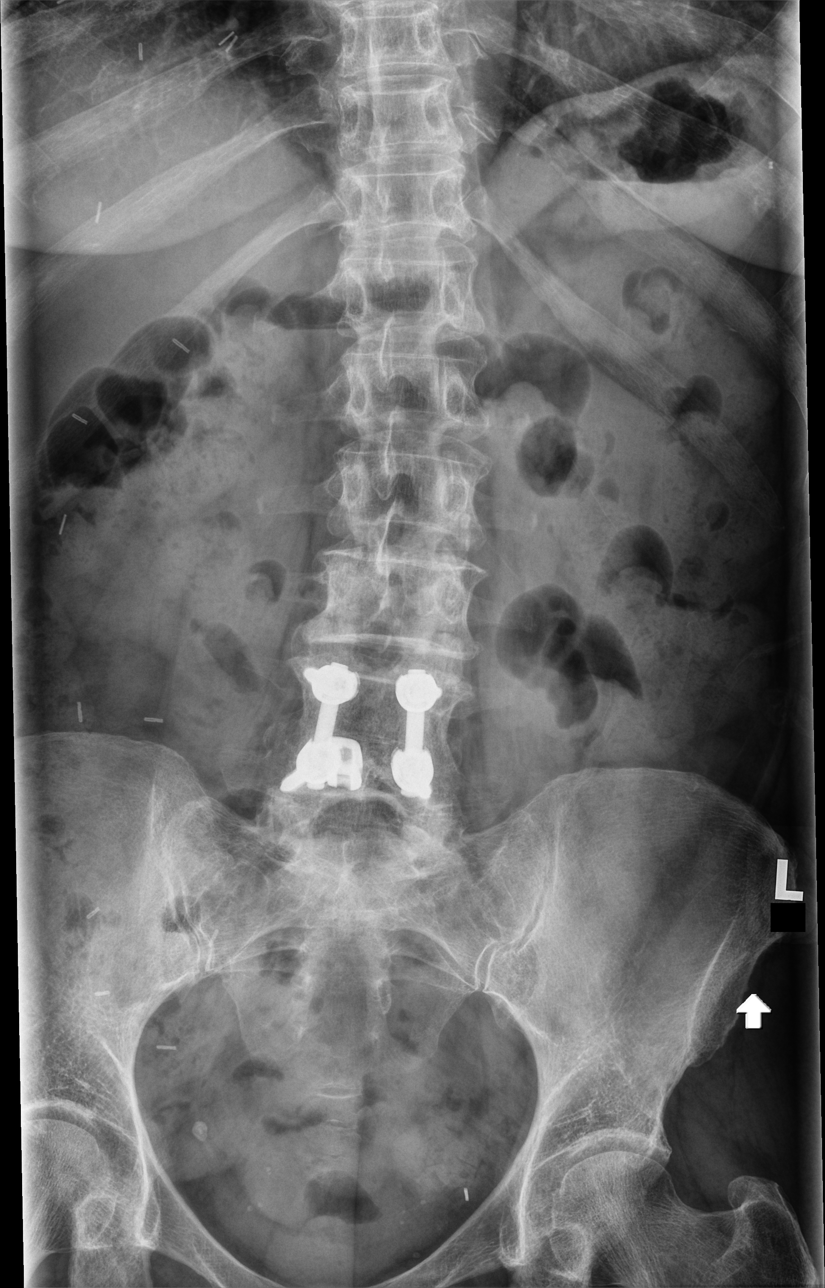

[lateral]
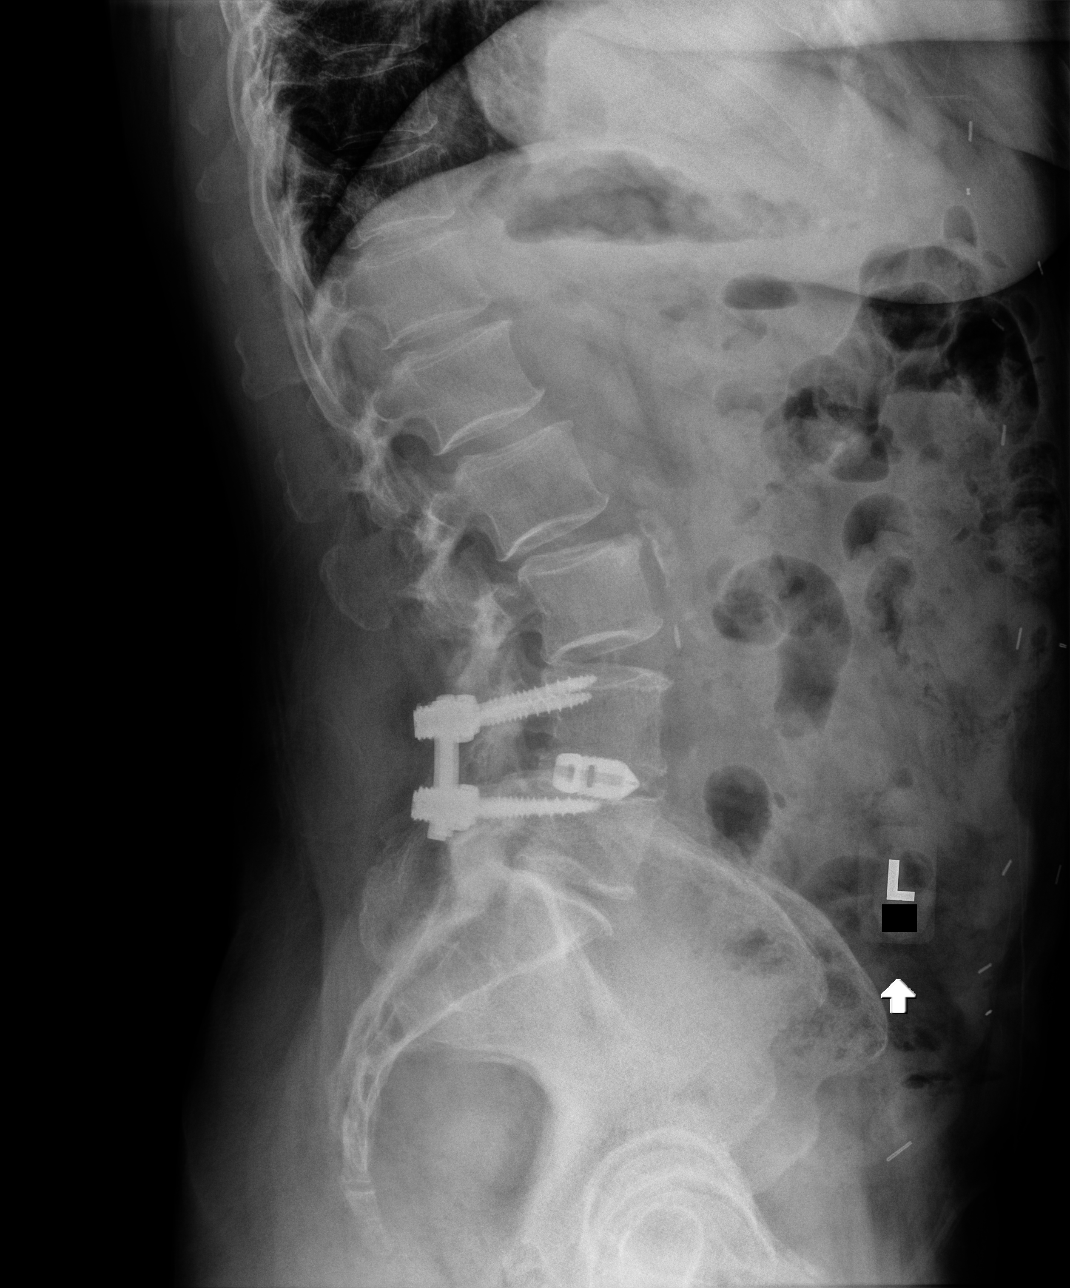

[lateral flex]
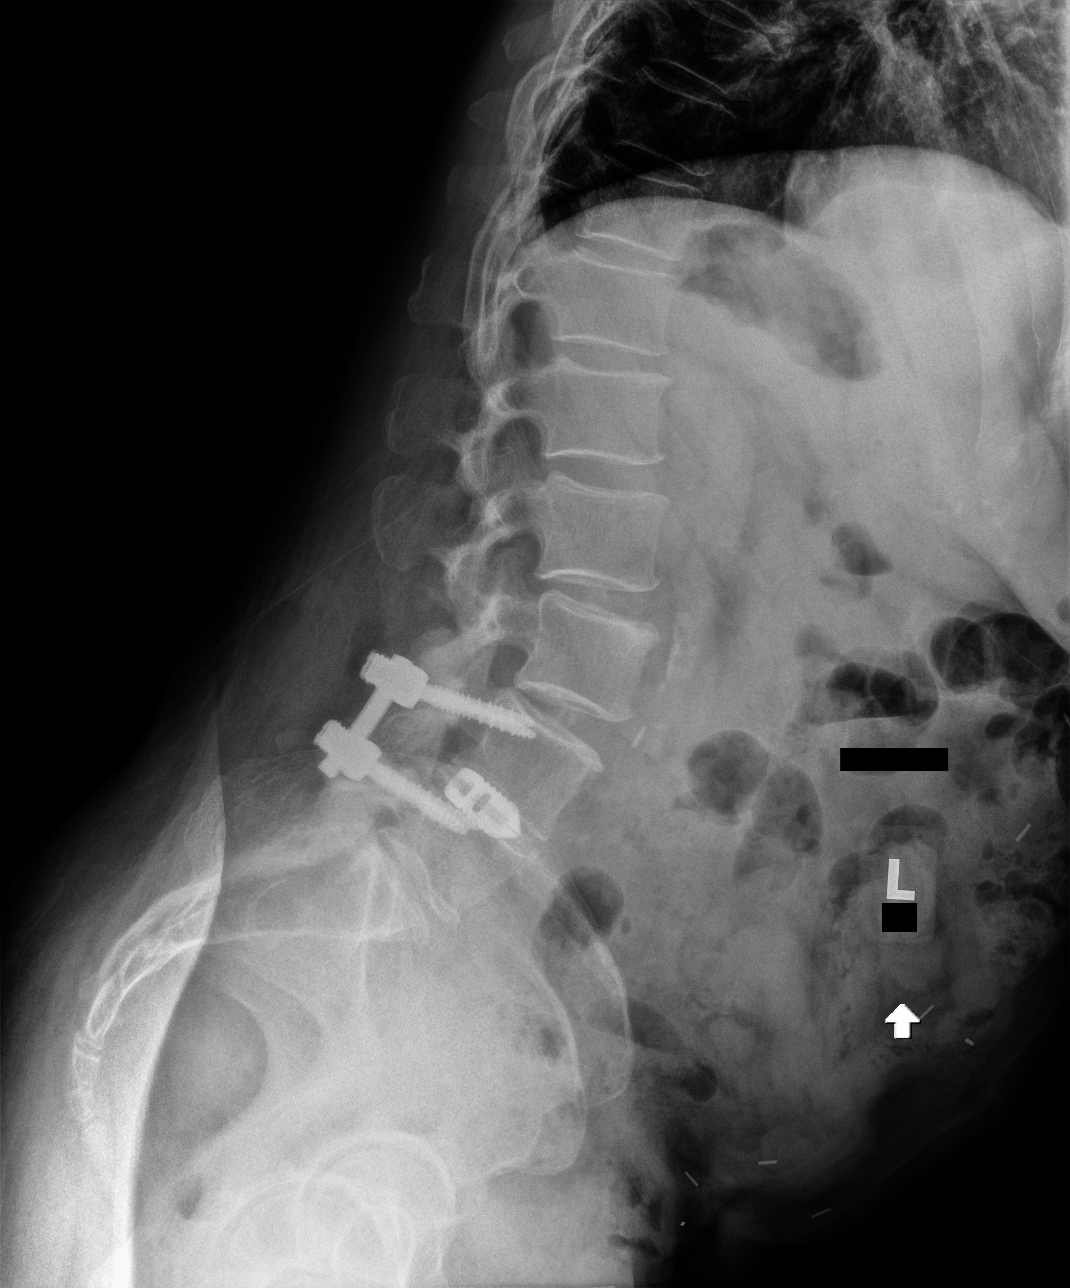

[lateral ext]
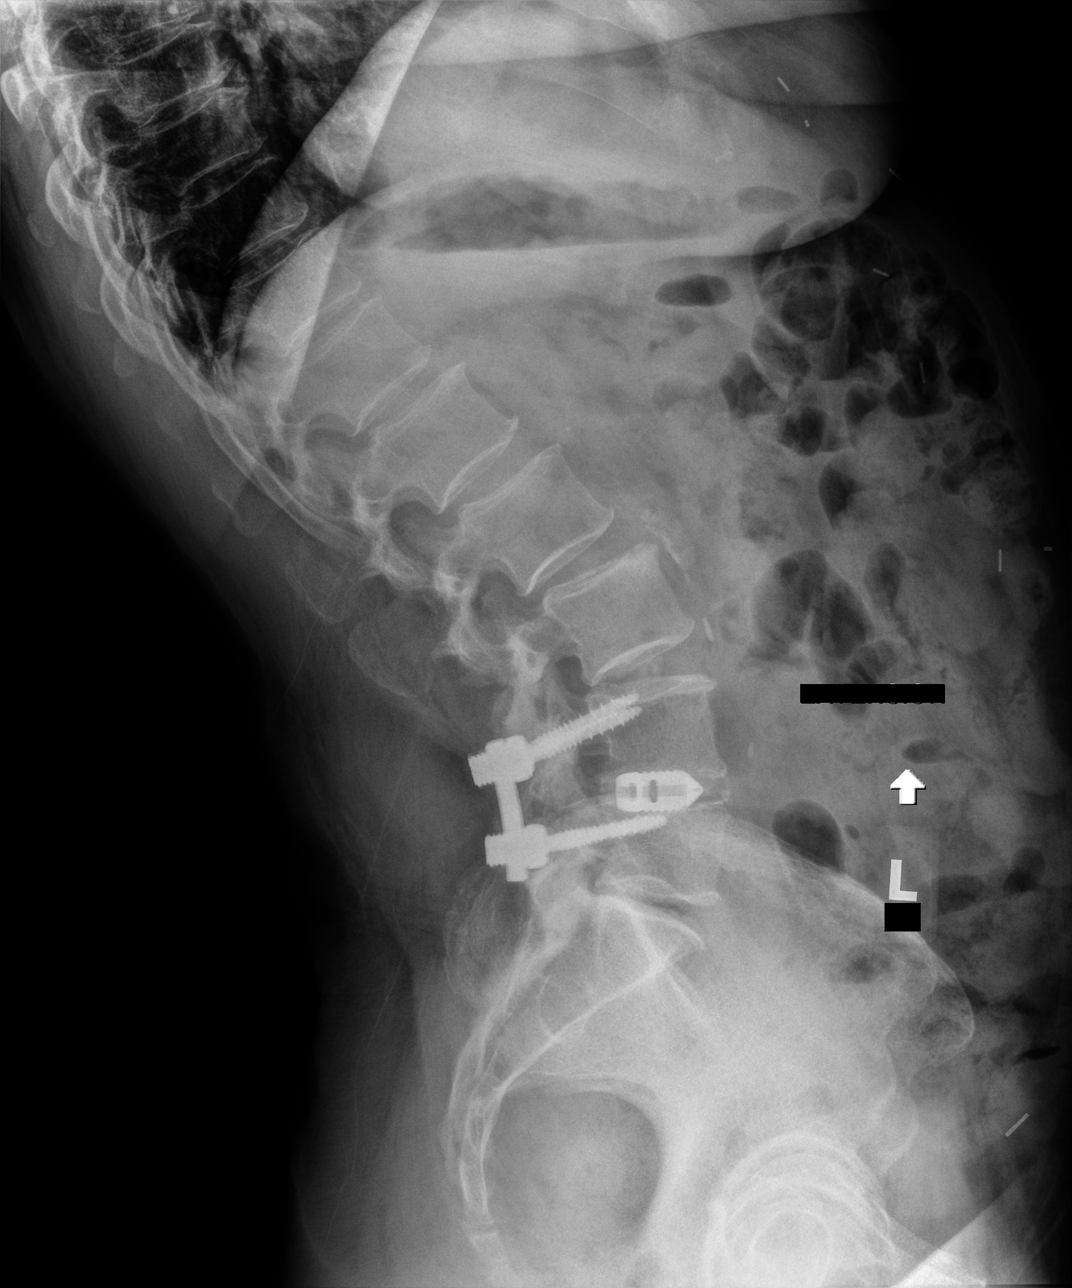

[4 of 4 positions shown; findings below may reference images not displayed]

FINDINGS: 5 lumbar type vertebral bodies. T12 level contains small ribs. 
Anterior posterior spinal fusion L4-L5 without hardware loosening or hardware 
fracture. Interbody spacer L4-L5. Mild levoconvex lumbar scoliosis. Laminectomy 
L4-L5. Grade 1 retrolisthesis L1 on L2 appears to slightly decreased with 
flexion and slightly increases with extension. Grade 1 retrolisthesis L2 on L3 
is stable with flexion and extension. Vertebral body height preserved. No 
fracture. Posterior loss of disc height at L3-L4 is stable. Small anterior 
marginal osteophytes. Atherosclerotic changes. Surgical clips scattered 
throughout the abdomen and pelvis. Right-sided pelvic phlebolith.
IMPRESSION: Marked 
Stable appearing postsurgical, degenerative, and scoliotic changes. Correlate 
with upcoming MRI lumbar spine.

## 2023-06-23 IMAGING — MR MRI LUMBAR SPINE WITHOUT CONTRAST
6 of 8 series · 16 of 48 positions shown · IV contrast (gadolinium)
Comparison: 06/06/2023 radiographs

________________________________________________________________________________________________ 
MRI LUMBAR SPINE WITHOUT CONTRAST, 06/23/2023 [DATE]: 
CLINICAL INDICATION: Radiculopathy, lumbar region
TECHNIQUE: Multiplanar, multiecho position MR images of the lumbar spine were 
performed without intravenous gadolinium enhancement. Patient was scanned on a 
3T magnet

[Series 101: survey · axial · 10.0mm · 1.39mm/px · 1 of 9 slices shown]
[im 1/9]
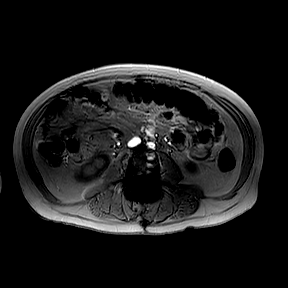

[Series 201: t2w_cor-surv · coronal · 6.0mm · 0.50mm/px · 1 of 5 slices shown]
[im 1/5]
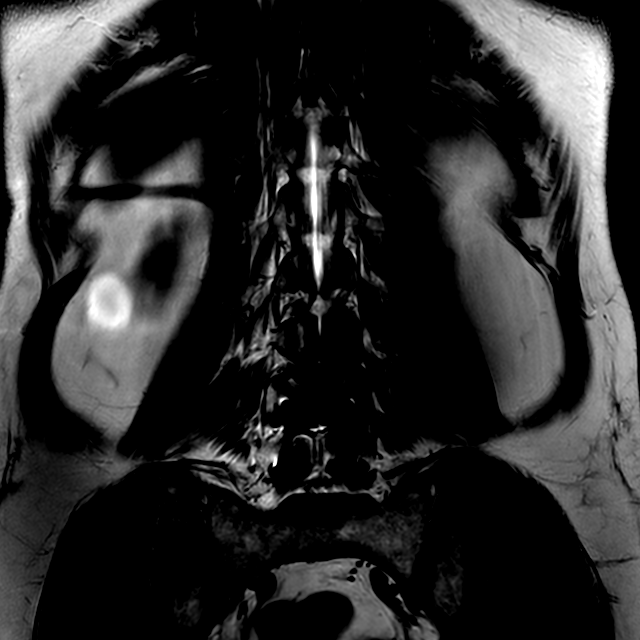

[Series 301: t1w_tse sag · sagittal · 4.0mm · 0.32mm/px · 2 of 17 slices shown]
[im 1/17]
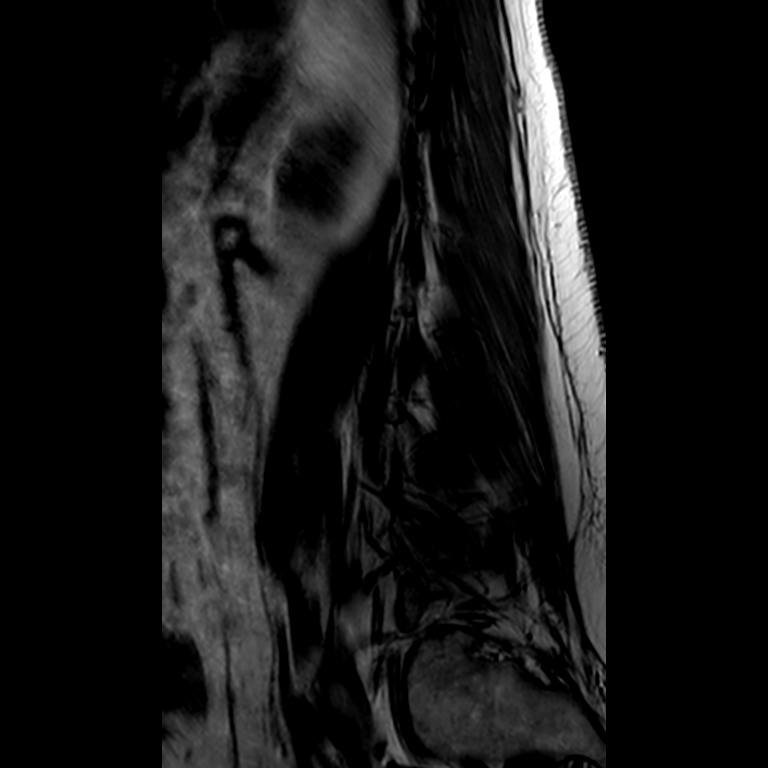
[im 17/17]
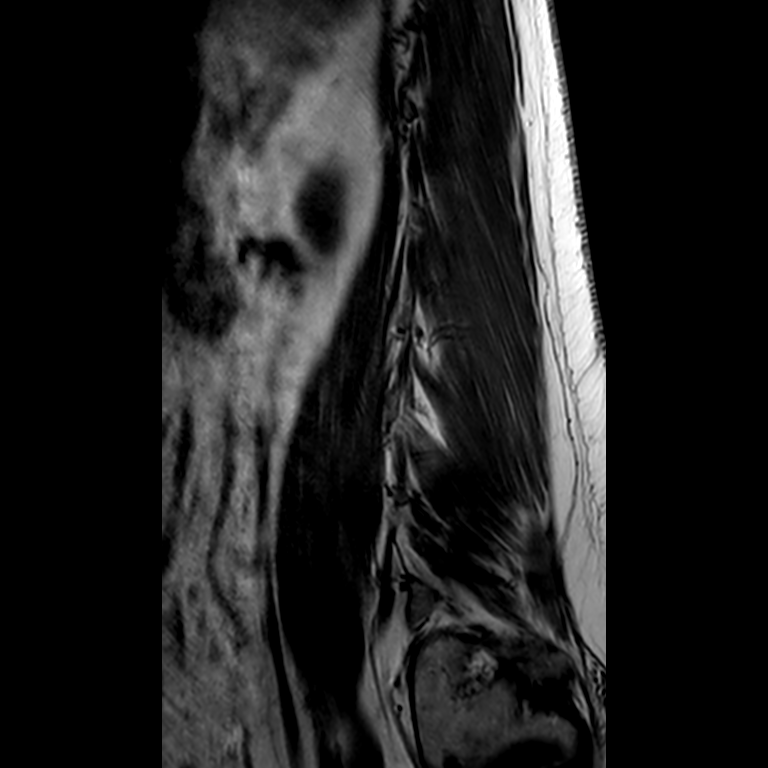

[Series 401: t2w_tse sag · sagittal · 4.0mm · 0.44mm/px · 3 of 17 slices shown]
[im 1/17]
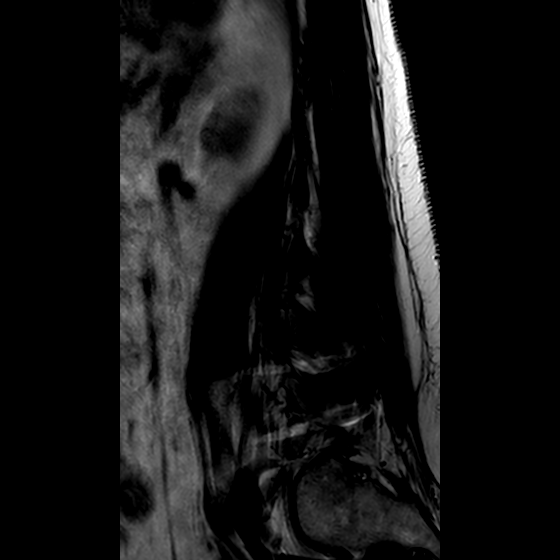
[im 9/17]
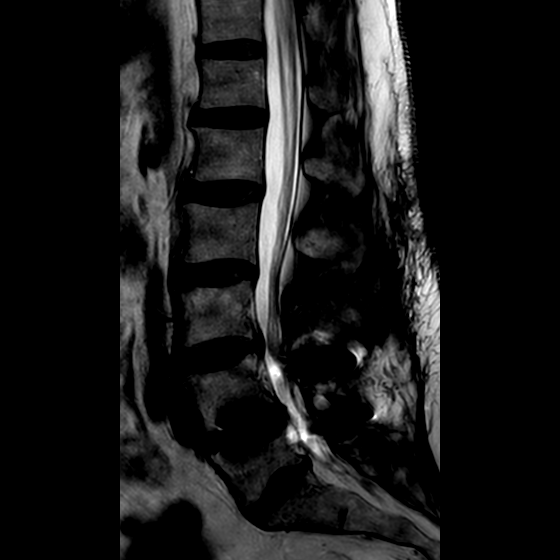
[im 17/17]
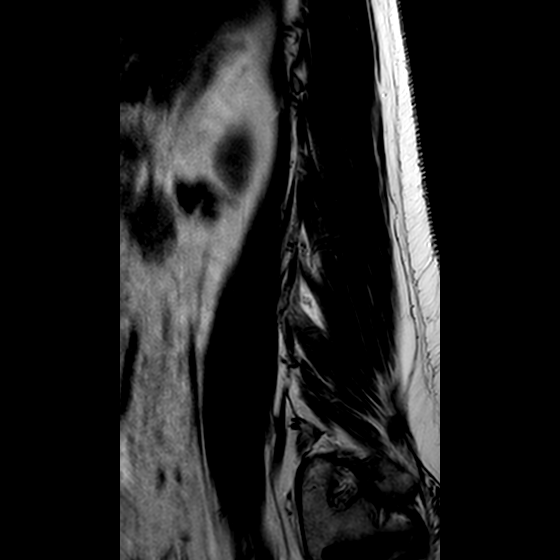

[Series 501: STIR · sagittal · 4.0mm · 0.56mm/px · 3 of 17 slices shown]
[im 1/17]
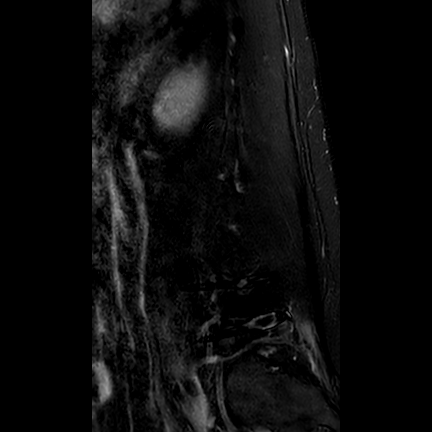
[im 9/17]
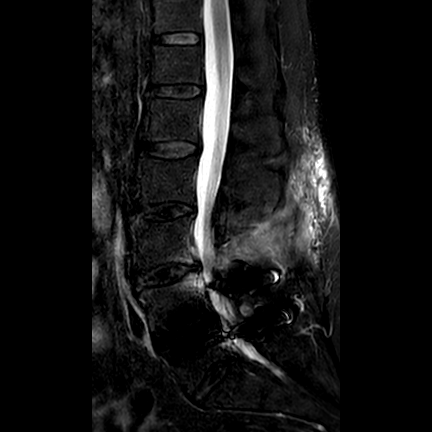
[im 17/17]
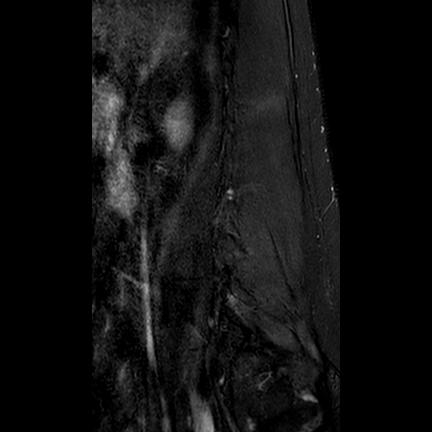

[Series 601: 3d_spine_view_t2w sag · sagittal · 1.5mm · 0.47mm/px · 6 of 107 slices shown]
[im 7/107]
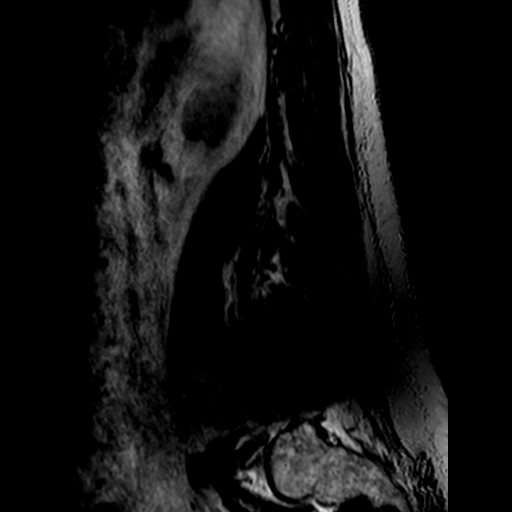
[im 20/107]
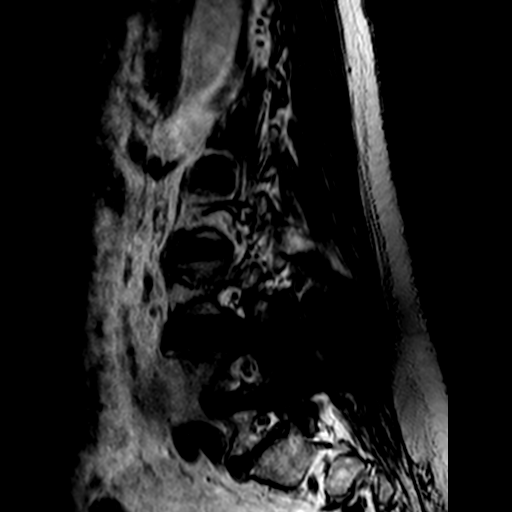
[im 34/107]
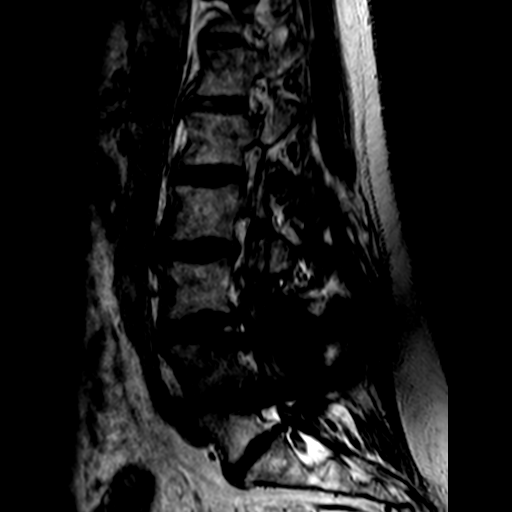
[im 47/107]
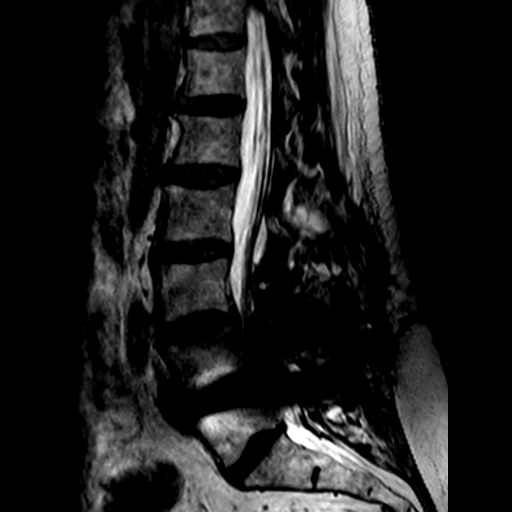
[im 60/107]
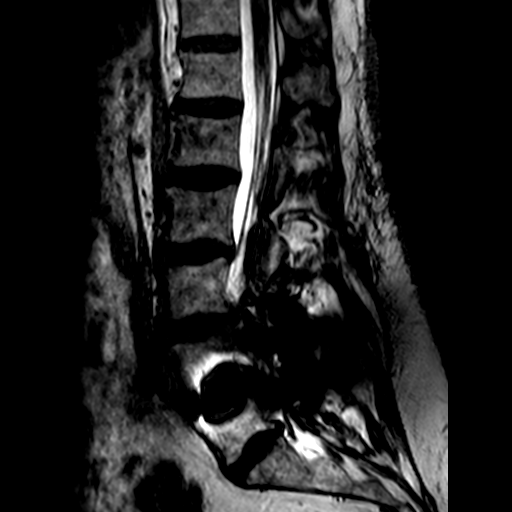
[im 73/107]
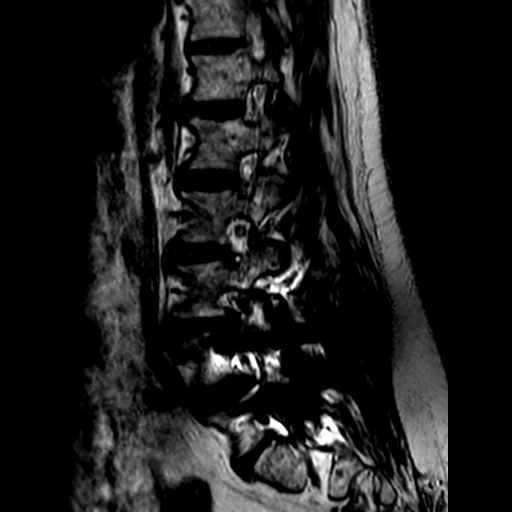

[16 of 48 positions shown; findings below may reference images not displayed]

FINDINGS: GENERAL: 
Nomenclature is based on 5 lumbar type vertebral bodies.  Hypoplastic left T12 
rib.  
ALIGNMENT: Normal. 
VERTEBRAE: Laminectomies at L3-4 through L5-S1. Susceptibility artifact from 
L4-5 pedicle screws/rods and metallic intervertebral disc spacer. No fracture. 
Multilevel osteophytes.  
MARROW SIGNAL: No focal suspect signal abnormality. 
CORD SIGNAL: Normal distal spinal cord and cauda equina. Conus medullaris 
terminates at L1. 
EXTRASPINAL STRUCTURES: Postsurgical change and edema of the posterior spinal 
soft tissues. 1.0 x 0.6 x 0.9 cm postoperative fluid focus in the posterior soft 
tissues at the level of L4-5. 3.6 cm right mid renal cyst. 
Modic I-II: Type I Modic changes at L4-5. 
Ligamentum Flavum > 2.5 mm: L1-3. 
SEGMENTAL: 
T12-L1: Normal disc height with disc desiccation. No herniation. Normal facets. 
No spinal canal or neural foraminal stenosis. 
L1-L2: Mild left-sided disc bulge. Normal disc height with disc desiccation. No 
herniation. Normal facets. No spinal canal or neural foraminal stenosis. 
L2-L3: Mild left-sided disc bulge, mild disc space narrowing and disc 
desiccation. No herniation. Normal facets. No spinal canal or neural foraminal 
stenosis. 
L3-L4: Disc bulge measures 0.4 cm in AP dimensions. Annular tear in the right 
subarticular region. No herniation. The degenerative facet joints are partially 
obscured by metallic hardware. Marked central canal stenosis (thecal sac 
measures 0.5 cm in AP dimensions) and moderate lateral recess stenosis. Mild 
neural foraminal stenosis. 
L4-L5: Metallic intervertebral disc spacer. No herniation. The degenerative 
facet joints are partially obscured by metallic hardware. Mild central canal 
stenosis. No neural foraminal stenosis. 
L5-S1: Disc bulge, mild disc space narrowing and disc desiccation.. No 
herniation. Moderate left and mild right facet arthropathy. Thecal sac measures 
0.8 cm in AP dimensions. Mild neural foraminal stenosis. 
IMPRESSION 
1.  Laminectomies at L3-4 through L5-S1 and L4-5 pedicle screws/rods and 
metallic intervertebral disc spacer. 1.0 x 0.6 x 0.9 cm postoperative fluid 
focus in the posterior soft tissues at the level of L4-5.  
2.  L3-L4 marked central canal stenosis, moderate lateral recess and mild neural 
foraminal stenosis. 
3.  Multifocal degenerative change, L4-L5 mild central canal stenosis and L5-S1 
mild neural foraminal stenosis.

## 2023-09-08 IMAGING — CT CT ABDOMEN AND PELVIS WITHOUT CONTRAST
2 of 3 series · 16 of 46 positions shown, 18 images · non-contrast
Comparison: 06/23/2023 renal sonogram

Referring: SOCHEATRA P BOKACH, PA-C               
________________________________________________________________________________________________ 
CT ABDOMEN AND PELVIS WITHOUT CONTRAST, 09/08/2023 [DATE]: 
CLINICAL INDICATION: Right Lower Quadrant Pain. History of breast carcinoma. 
A search for DICOM formatted images was conducted for prior CT imaging studies 
completed at a non-affiliated media free facility.
TECHNIQUE: The abdomen and pelvis were scanned from lung bases through the 
pubic rami without contrast on a high-resolution CT scanner using dose reduction 
techniques. Routine MPR reconstructions were performed. Count of known CT and 
Cardiac Nuclear Medicine studies performed in the previous 12 months = 0.

[Series 2: abd/pel ax wo · axial · 0.77mm/px · z∈[-435,-30]mm · 13 of 155 slices shown, 15 images]
[im 10/155  soft-tissue]
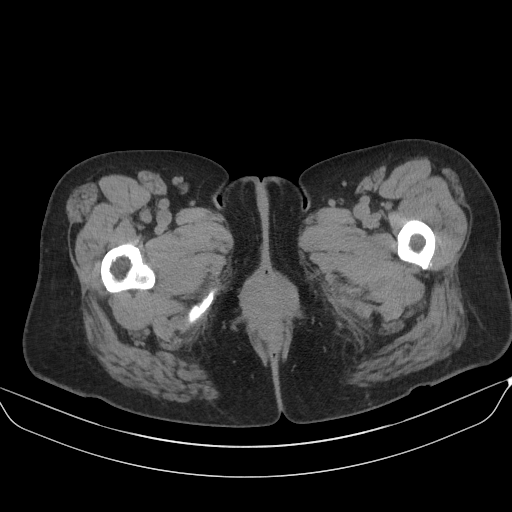
[im 10/155  bone]
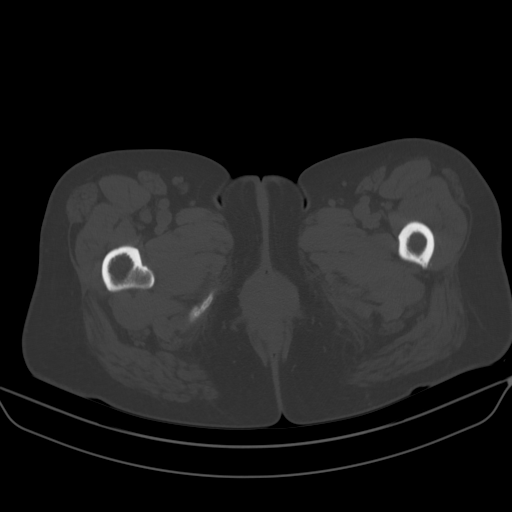
[im 20/155  soft-tissue]
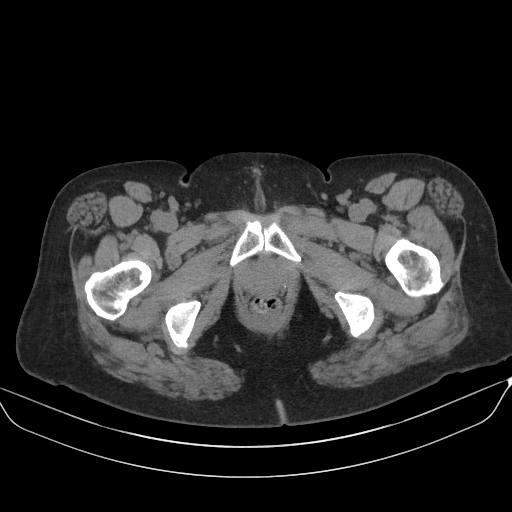
[im 30/155  soft-tissue]
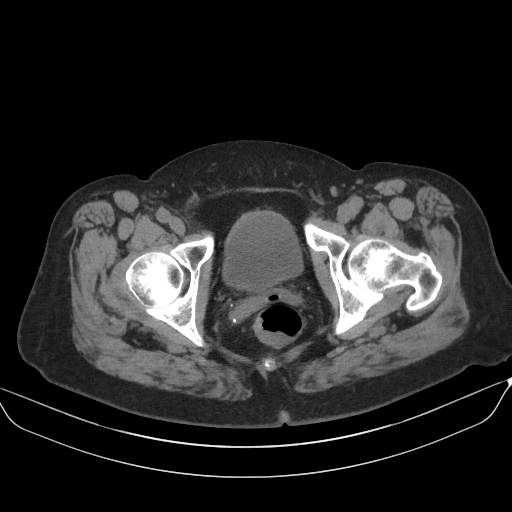
[im 45/155  soft-tissue]
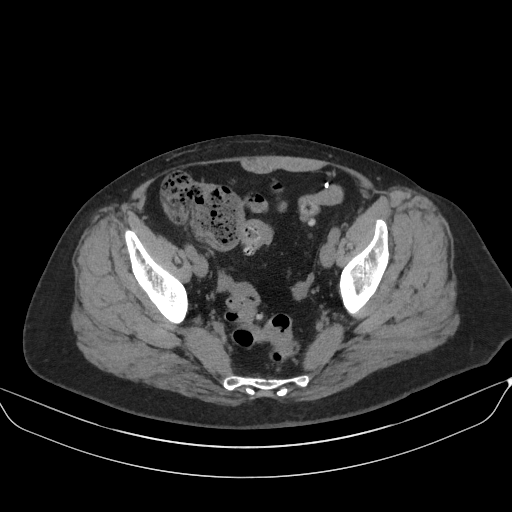
[im 55/155  soft-tissue]
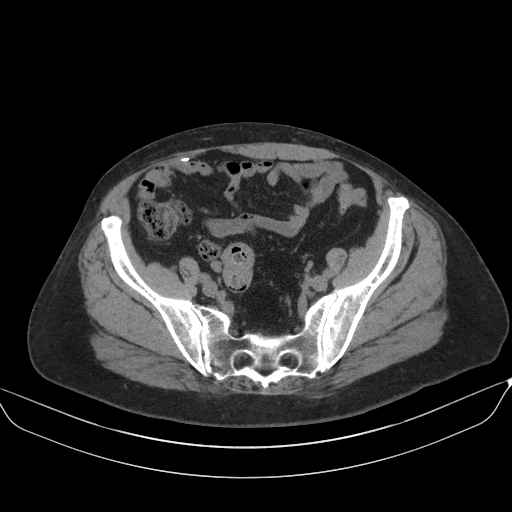
[im 65/155  soft-tissue]
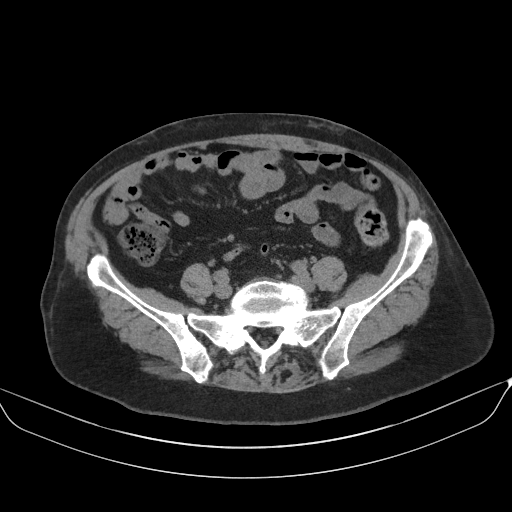
[im 80/155  soft-tissue]
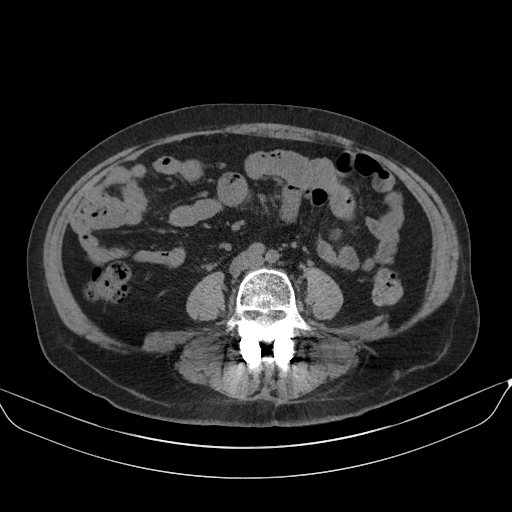
[im 90/155  soft-tissue]
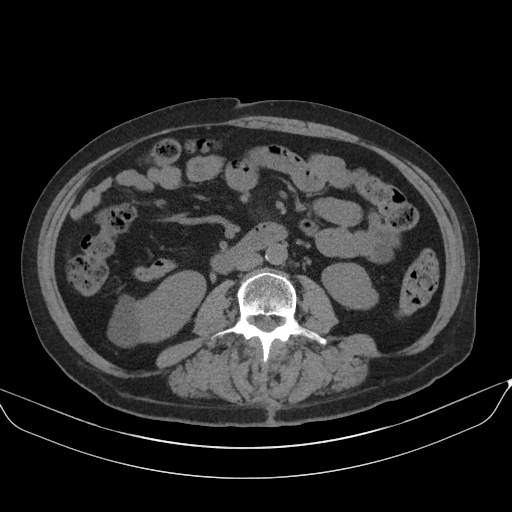
[im 100/155  soft-tissue]
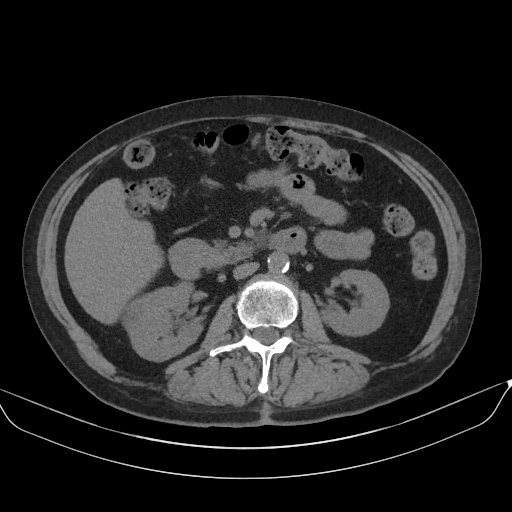
[im 100/155  bone]
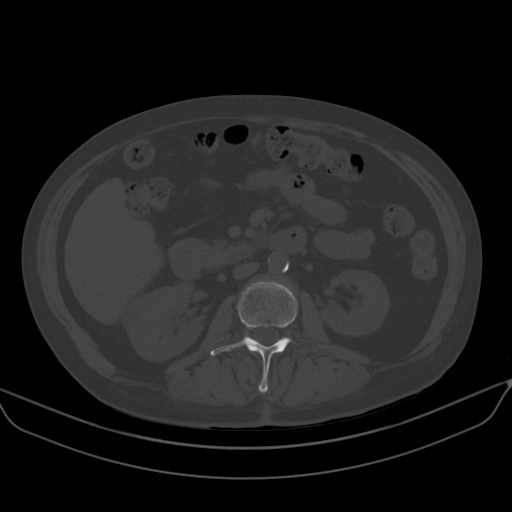
[im 110/155  soft-tissue]
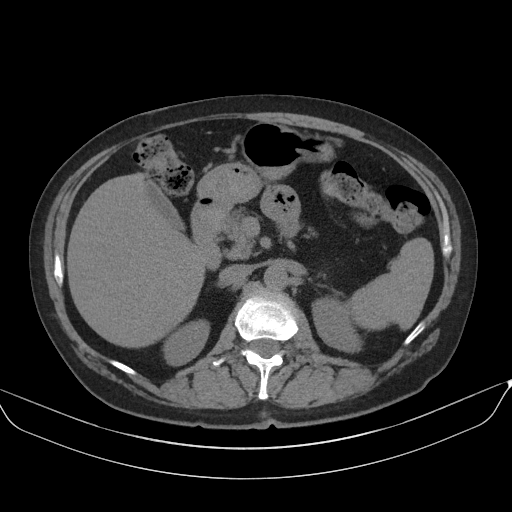
[im 125/155  soft-tissue]
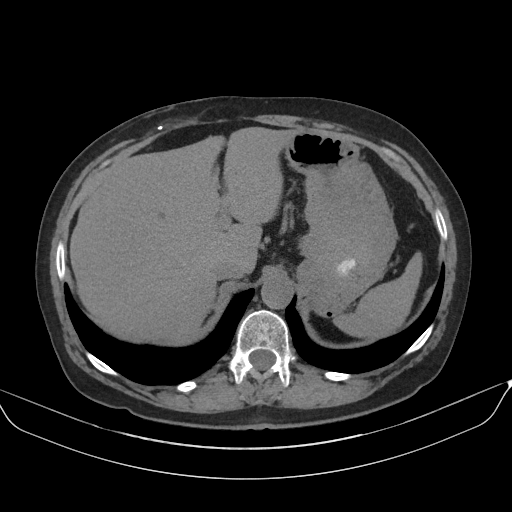
[im 135/155  soft-tissue]
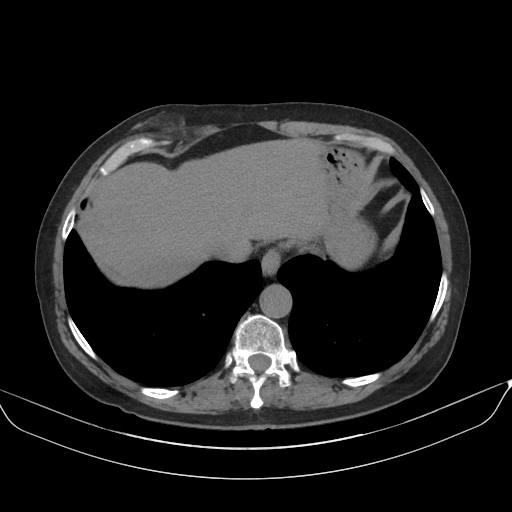
[im 145/155  soft-tissue]
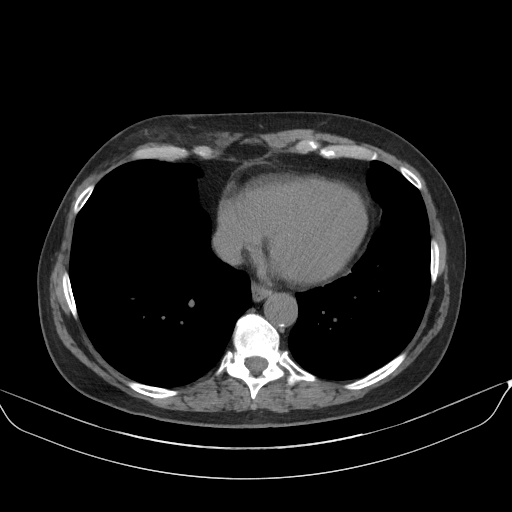

[Series 4: cor from thins · coronal · 0.75mm/px · 3 of 130 slices shown]
[im 44/130  soft-tissue]
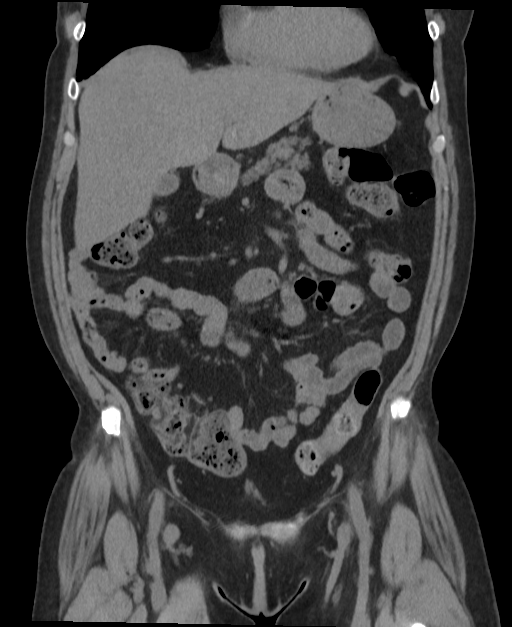
[im 58/130  soft-tissue]
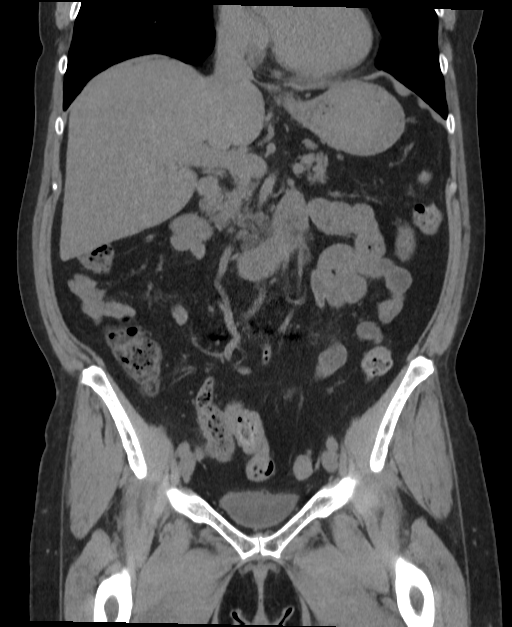
[im 72/130  soft-tissue]
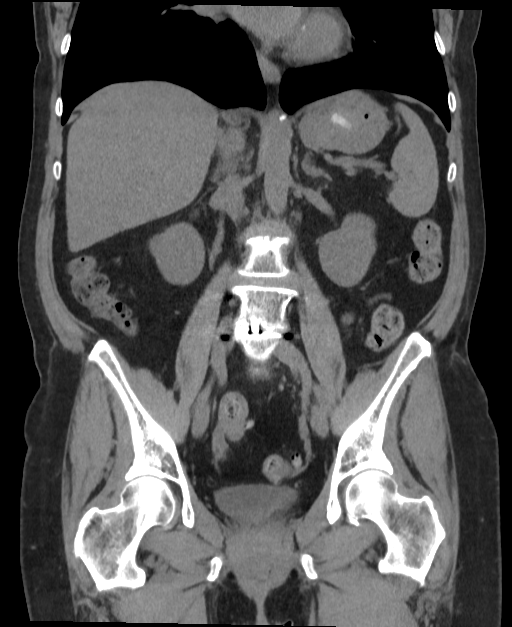

[16 of 46 positions shown; findings below may reference images not displayed]

FINDINGS: LUNG BASES: Lung bases are clear. No pleural effusions. 
HEPATOBILIARY: Liver measures 15.4 cm in height. Fatty infiltration of the 
liver. Non contrast images show no mass or biliary dilatation. Tiny hepatic 
cysts. No gallstones. 
SPLEEN: Normal in size. 
PANCREAS: No evidence for ductal dilatation or mass.   
ADRENALS: No mass. 
GENITOURINARY: 4.8 x 3.6 cm right mid renal cystic focus: Sonogram demonstrated 
Bosniak 2 cyst. No hydronephrosis or kidney stones. Bladder is unremarkable. 
Hysterectomy. 
LYMPH NODES: No adenopathy. 
STOMACH, SMALL BOWEL AND COLON: Stool throughout the colon. Colonic 
diverticulosis without evidence of diverticulitis. No bowel wall thickening or 
obstruction. Normal appendix. 
VASCULAR STRUCTURES: No aneurysm. Atherosclerosis. 
MUSCULOSKELETAL: Right-sided TRAM flap. L4-5 posterior decompression, pedicle 
screw and rod fixation. No acute osseous abnormality. Scattered degenerative 
changes.
IMPRESSION: 1.  Normal appendix. 
2.  Fatty infiltration of the liver and tiny hepatic cysts. 
3.  Bosniak 2 cyst.  
4.  Colonic diverticulosis. 
5.  Right-sided TRAM flap.  
6.  Degenerative change and L4-5 postsurgical change. 
RADIATION DOSE REDUCTION: All CT scans are performed using radiation dose 
reduction techniques, when applicable.  Technical factors are evaluated and 
adjusted to ensure appropriate moderation of exposure.  Automated dose 
management technology is applied to adjust the radiation doses to minimize 
exposure while achieving diagnostic quality images.
